# Patient Record
Sex: Female | Born: 1955 | Race: White | Hispanic: No | Marital: Married | Smoking: Never smoker
Health system: Southern US, Community
[De-identification: ages and names within clinical notes are randomized; demographics above are authoritative.]

## PROBLEM LIST (undated history)

## (undated) DIAGNOSIS — Z6841 Body Mass Index (BMI) 40.0 and over, adult: Secondary | ICD-10-CM

## (undated) DIAGNOSIS — K219 Gastro-esophageal reflux disease without esophagitis: Secondary | ICD-10-CM

## (undated) DIAGNOSIS — Z87442 Personal history of urinary calculi: Secondary | ICD-10-CM

## (undated) DIAGNOSIS — C801 Malignant (primary) neoplasm, unspecified: Secondary | ICD-10-CM

## (undated) DIAGNOSIS — M1990 Unspecified osteoarthritis, unspecified site: Secondary | ICD-10-CM

## (undated) HISTORY — PX: CHOLECYSTECTOMY: SHX55

---

## 2015-06-16 HISTORY — PX: RETINAL LASER PROCEDURE: SHX2339

## 2015-06-16 HISTORY — PX: CATARACT EXTRACTION: SUR2

## 2016-01-01 ENCOUNTER — Other Ambulatory Visit (HOSPITAL_COMMUNITY): Payer: Self-pay | Admitting: General Surgery

## 2016-01-14 ENCOUNTER — Other Ambulatory Visit: Payer: Self-pay

## 2016-01-14 ENCOUNTER — Ambulatory Visit (HOSPITAL_COMMUNITY)
Admission: RE | Admit: 2016-01-14 | Discharge: 2016-01-14 | Disposition: A | Discharge status: Home / Self Care | Payer: BC Managed Care – PPO | Source: Ambulatory Visit | Attending: General Surgery | Admitting: General Surgery

## 2016-01-14 DIAGNOSIS — K222 Esophageal obstruction: Secondary | ICD-10-CM | POA: Insufficient documentation

## 2016-01-14 DIAGNOSIS — K219 Gastro-esophageal reflux disease without esophagitis: Secondary | ICD-10-CM | POA: Insufficient documentation

## 2016-01-14 DIAGNOSIS — N200 Calculus of kidney: Secondary | ICD-10-CM | POA: Diagnosis not present

## 2016-01-14 DIAGNOSIS — I700 Atherosclerosis of aorta: Secondary | ICD-10-CM | POA: Diagnosis not present

## 2016-01-14 DIAGNOSIS — Z6841 Body Mass Index (BMI) 40.0 and over, adult: Secondary | ICD-10-CM | POA: Insufficient documentation

## 2016-01-14 DIAGNOSIS — K449 Diaphragmatic hernia without obstruction or gangrene: Secondary | ICD-10-CM | POA: Diagnosis not present

## 2016-01-16 ENCOUNTER — Encounter: Payer: Self-pay | Admitting: Dietician

## 2016-01-16 ENCOUNTER — Encounter: Payer: BC Managed Care – PPO | Attending: General Surgery | Admitting: Dietician

## 2016-01-16 DIAGNOSIS — Z713 Dietary counseling and surveillance: Secondary | ICD-10-CM | POA: Diagnosis not present

## 2016-01-16 DIAGNOSIS — Z01818 Encounter for other preprocedural examination: Secondary | ICD-10-CM | POA: Insufficient documentation

## 2016-01-16 NOTE — Progress Notes (Signed)
  Pre-Op Assessment Visit:  Pre-Operative RYGB or Sleeve Gastrectomy Surgery  Medical Nutrition Therapy:  Appt start time: 1000   End time:  1100.  Patient was seen on 01/16/2016 for Pre-Operative Nutrition Assessment. Assessment and letter of approval faxed to [facility] Bariatric Surgery Program coordinator on 01/16/2016.   Preferred Learning Style:   No preference indicated   Learning Readiness:   Ready  Handouts given during visit include:  Pre-Op Goals Bariatric Surgery Protein Shakes   During the appointment today the following Pre-Op Goals were reviewed with the patient: Maintain or lose weight as instructed by your surgeon Make healthy food choices Begin to limit portion sizes Limited concentrated sugars and fried foods Keep fat/sugar in the single digits per serving on   food labels Practice CHEWING your food  (aim for 30 chews per bite or until applesauce consistency) Practice not drinking 15 minutes before, during, and 30 minutes after each meal/snack Avoid all carbonated beverages  Avoid/limit caffeinated beverages  Avoid all sugar-sweetened beverages Consume 3 meals per day; eat every 3-5 hours Make a list of non-food related activities Aim for 64-100 ounces of FLUID daily  Aim for at least 60-80 grams of PROTEIN daily Look for a liquid protein source that contain ?15 g protein and ?5 g carbohydrate  (ex: shakes, drinks, shots)  Demonstrated degree of understanding via:  Teach Back  Teaching Method Utilized:  Visual Auditory Hands on  Barriers to learning/adherence to lifestyle change: none  Patient to call the [facility] to enroll in Pre-Op and Post-Op Nutrition Education when surgery date is scheduled.

## 2016-01-16 NOTE — Patient Instructions (Signed)
Follow Pre-Op Goals Try Protein Shakes Call [facility] at [phone] when surgery is scheduled to enroll in Pre-Op Class  Things to remember:  Please always be honest with [place]. We want to support you!  If you have any questions or concerns in between appointments, please call or email Ferol Luz, or Margarita Grizzle.  The diet after surgery will be high protein and low in carbohydrate.  Vitamins and calcium need to be taken for the rest of your life.  Feel free to include support people in any classes or appointments.   Supplement recommendations:  "Complete" Multivitamin: Sleeve Gastrectomy and RYGB patients take a double dose of MVI. Vitamin must be liquid or chewable but not gummy. Examples of these include Flintstones Complete and Centrum Complete. If the vitamin is bariatric-specific, take 1 dose as it is already formulated for bariatric surgery patients. Examples of these are Bariatric Advantage, Celebrate, and [employer]. These can be found at the [facility] and/or online.     Calcium citrate: 1500 mg/day of Calcium citrate (also chewable or liquid) is recommended for all procedures. The body is only able to absorb 500-600 mg of Calcium at one time so 3 daily doses of 500 mg are recommended. Calcium doses must be taken a minimum of 2 hours apart. Additionally, Calcium must be taken 2 hours apart from iron-containing MVI. Examples of brands include Celebrate, Bariatric Advantage, and Wellesse. These brands must be purchased online or at the [facility]. Citracal Petites is the only Calcium citrate supplement found in general grocery stores and pharmacies. This is in tablet form and may be recommended for patients who do not tolerate chewable Calcium.  Continued or added Vitamin D supplementation based on individual needs.    Vitamin B12: 300-500 mcg/day for Sleeve Gastrectomy and RYGB. Must be taken intramuscularly, sublingually, or inhaled nasally. Oral route  is not recommended.

## 2016-02-24 ENCOUNTER — Ambulatory Visit: Payer: Self-pay | Admitting: General Surgery

## 2016-02-24 ENCOUNTER — Encounter: Payer: BC Managed Care – PPO | Attending: General Surgery

## 2016-02-24 DIAGNOSIS — Z713 Dietary counseling and surveillance: Secondary | ICD-10-CM | POA: Diagnosis present

## 2016-02-24 DIAGNOSIS — Z01818 Encounter for other preprocedural examination: Secondary | ICD-10-CM | POA: Insufficient documentation

## 2016-02-26 NOTE — Progress Notes (Signed)
  Pre-Operative Nutrition Class:  Appt start time: 2091   End time:  1830.  Patient was seen on 02/24/2016 for Pre-Operative Bariatric Surgery Education at the [facility].   Surgery date:  Surgery type: RYGB Start weight at [facility]: 256 lbs on 01/16/2016 Weight today: 261.8 lbs  TANITA  BODY COMP RESULTS  02/24/16   BMI (kg/m^2) 43.6   Fat Mass (lbs) 134.6   Fat Free Mass (lbs) 127.2   Total Body Water (lbs) 93.2   Samples given per MNT protocol. Patient educated on appropriate usage: Bariatric Advantage Multivitamin (mixed fruit - qty 1) Lot #: [id number] Exp: 12/2016  Bariatric Advantage Calcium Citrate chew (strawberry - qty 1) Lot #: [id number] Exp: 10/2016  Premier protein shake (qty 1 - chocolate) Lot #: [id number] Exp: 12/2016   The following the learning objectives were met by the patient during this course:  Identify Pre-Op Dietary Goals and will begin 2 weeks pre-operatively  Identify appropriate sources of fluids and proteins   State protein recommendations and appropriate sources pre and post-operatively  Identify Post-Operative Dietary Goals and will follow for 2 weeks post-operatively  Identify appropriate multivitamin and calcium sources  Describe the need for physical activity post-operatively and will follow MD recommendations  State when to call healthcare provider regarding medication questions or post-operative complications  Handouts given during class include:  Pre-Op Bariatric Surgery Diet Handout  Protein Shake Handout  Post-Op Bariatric Surgery Nutrition Handout  BELT Program Information Flyer  Support Group Information Flyer  [facility] Bariatric Supplements Price List  Follow-Up Plan: Patient will follow-up at [facility] 2 weeks post operatively for diet advancement per MD.

## 2016-02-28 ENCOUNTER — Ambulatory Visit: Payer: Self-pay | Admitting: General Surgery

## 2016-02-28 NOTE — H&P (Signed)
Jenny Thornton 02/28/2016 1:22 PM Location: [facility] Patient #: [id number] DOB: 03-29-1956 Married / Language: English / Race: White Female  History of Present Illness Randall Hiss M. Aliena Ghrist MD; 02/28/2016 3:20 PM) Patient words: preop.  The patient is a 60 year old female who presents for a pre-op visit. She comes in today for her preoperative visit. I initially met her for weight loss surgery on July 14. Her weight at time was 254 pounds. She has been approved for laparoscopic Roux-en-Y gastric bypass with possible hiatal hernia repair. She denies any medical changes since I initially saw her in mid July. She denies a emergency room hospital. She denies any chest pain, chest pressure, shortness of breath, TIAs, amaurosis fugax, dysphagia exertion, orthopnea. She denies any dysphagia. She denies any reflux.  Her workup revealed a vitamin D deficiency. Her vitamin D level was 11. I put her on 50,000 international units of vitamin D once a week. She has completed this. She also was found to be prediabetic with a hemoglobin A1c of 6.1%. Otherwise her evaluation labs were within normal limits. Her chest x-ray and EKG were unremarkable.  Her GI showed a small hiatal hernia and a distal esophageal ring.   Problem List/Past Medical Randall Hiss Ronnie Derby, MD; 02/28/2016 3:26 PM) PREDIABETES (R73.03) MORBID OBESITY WITH BMI OF 40.0-44.9, ADULT (E66.01) VITAMIN D DEFICIENCY (E55.9) vit d was 42  Other Problems Gayland Curry, MD; 02/28/2016 3:26 PM) GASTROESOPHAGEAL REFLUX DISEASE, ESOPHAGITIS PRESENCE NOT SPECIFIED (K21.9) ARTHRITIS (M19.90) Migraine Headache Diverticulosis Cholelithiasis Kidney Stone Hemorrhoids Back Pain  Past Surgical History Gayland Curry, MD; 02/28/2016 3:26 PM) Gallbladder Surgery - Laparoscopic Cesarean Section - Multiple  Diagnostic Studies History Gayland Curry, MD; 02/28/2016 3:26 PM) Mammogram within last year Colonoscopy 1-5 years  ago Pap Smear 1-5 years ago  Allergies (Ammie Eversole, LPN; 4/78/2956 2:13 PM) Codeine Sulfate *ANALGESICS - OPIOID* Rash. Morphine Sulfate ER *ANALGESICS - OPIOID* Penicillin V *PENICILLINS* Rash.  Medication History Gayland Curry, MD; 02/28/2016 3:26 PM) Vitamin D (Ergocalciferol) (50000UNIT Capsule, 1 (one) Capsule Oral weekly, Taken starting 01/20/2016) Active. MethylPREDNISolone (4MG Tab Ther Pack, Oral) Active. B Complex 100 (Oral) Active. Cyclobenzaprine HCl (5MG Tablet, Oral) Active. LORazepam (0.5MG Tablet, Oral) Active. Fiber Formula (Oral) Active. Multiple Vitamins (Oral) Active. Vitamin D3 (2000UNIT Capsule, Oral) Active. Cyanocobalamin (500MCG Tablet ER, Oral) Active. Unisom (25MG Tablet, Oral) Active. Medications Reconciled OxyCODONE HCl (5MG/5ML Solution, 5-10 Milliliter Oral every four hours, as needed, Taken starting 02/28/2016) Active. Zofran ODT (4MG Tablet Disint, 1 (one) Tablet Disperse Oral every six hours, as needed, Taken starting 02/28/2016) Active. Pantoprazole Sodium (40MG Tablet DR, 1 (one) Tablet Oral daily, Taken starting 02/28/2016) Active.  Social History Gayland Curry, MD; 02/28/2016 3:26 PM) No drug use No alcohol use Tobacco use Never smoker.  Family History Gayland Curry, MD; 02/28/2016 3:26 PM) Cancer Mother. Alcohol Abuse Daughter, Family Members In General. Colon Polyps Mother. Cerebrovascular Accident Mother. Hypertension Father. Depression Daughter. Respiratory Condition Father. Prostate Cancer Father.  Pregnancy / Birth History Gayland Curry, MD; 02/28/2016 3:26 PM) Durenda Age 1 Contraceptive History Oral contraceptives. Length (months) of breastfeeding 3-6 Irregular periods Age of menopause [age] Age at menarche 8 years. Para 3     Review of Systems Randall Hiss M. Erez Mccallum MD; 02/28/2016 3:18 PM) General Not Present- Appetite Loss, Chills, Fatigue, Fever, Night Sweats, Weight Gain and Weight  Loss. Skin Not Present- Change in Wart/Mole, Dryness, Hives, Jaundice, New Lesions, Non-Healing Wounds, Rash and Ulcer. HEENT Present- Ringing in the  Ears. Not Present- Earache, Hearing Loss, Hoarseness, Nose Bleed, Oral Ulcers, Seasonal Allergies, Sinus Pain, Sore Throat, Visual Disturbances, Wears glasses/contact lenses and Yellow Eyes. Respiratory Present- Snoring. Not Present- Bloody sputum, Chronic Cough, Difficulty Breathing and Wheezing. Breast Not Present- Breast Mass, Breast Pain, Nipple Discharge and Skin Changes. Cardiovascular Not Present- Chest Pain, Difficulty Breathing Lying Down, Leg Cramps, Palpitations, Rapid Heart Rate, Shortness of Breath and Swelling of Extremities. Gastrointestinal Present- Hemorrhoids and Indigestion. Not Present- Abdominal Pain, Bloating, Bloody Stool, Change in Bowel Habits, Chronic diarrhea, Constipation, Difficulty Swallowing, Excessive gas, Gets full quickly at meals, Nausea, Rectal Pain and Vomiting. Female Genitourinary Not Present- Frequency, Nocturia, Painful Urination, Pelvic Pain and Urgency. Musculoskeletal Present- Back Pain, Joint Pain, Joint Stiffness, Muscle Pain and Muscle Weakness. Not Present- Swelling of Extremities. Neurological Not Present- Decreased Memory, Fainting, Headaches, Numbness, Seizures, Tingling, Tremor, Trouble walking and Weakness. Psychiatric Not Present- Anxiety, Bipolar, Change in Sleep Pattern, Depression, Fearful and Frequent crying. Endocrine Not Present- Cold Intolerance, Excessive Hunger, Hair Changes, Heat Intolerance, Hot flashes and New Diabetes. Hematology Not Present- Blood Thinners, Easy Bruising, Excessive bleeding, Gland problems, HIV and Persistent Infections.  Vitals (Ammie Eversole LPN; 5/97/4163 8:45 PM) 02/28/2016 1:22 PM Weight: 258.2 lb Height: 65in Body Surface Area: 2.2 m Body Mass Index: 42.97 kg/m  Temp.: 98.68F(Oral)  Pulse: 100 (Regular)  BP: 126/82 (Sitting, Left Arm,  Standard)      Physical Exam Randall Hiss M. Carmesha Morocco MD; 02/28/2016 3:18 PM)  General Mental Status-Alert. General Appearance-Consistent with stated age. Hydration-Well hydrated. Voice-Normal. Note: Morbidly obese  Head and Neck Head-normocephalic, atraumatic with no lesions or palpable masses. Trachea-midline. Thyroid Gland Characteristics - normal size and consistency.  Eye Eyeball - Bilateral-Extraocular movements intact. Sclera/Conjunctiva - Bilateral-No scleral icterus.  Chest and Lung Exam Chest and lung exam reveals -quiet, even and easy respiratory effort with no use of accessory muscles and on auscultation, normal breath sounds, no adventitious sounds and normal vocal resonance. Inspection Chest Wall - Normal. Back - normal.  Breast - Did not examine.  Cardiovascular Cardiovascular examination reveals -normal heart sounds, regular rate and rhythm with no murmurs and normal pedal pulses bilaterally.  Abdomen Inspection Inspection of the abdomen reveals - No Hernias. Skin - Scar - Note: Well-healed Pfannenstiel incision. Palpation/Percussion Palpation and Percussion of the abdomen reveal - Soft, Non Tender, No Rebound tenderness, No Rigidity (guarding) and No hepatosplenomegaly. Auscultation Auscultation of the abdomen reveals - Bowel sounds normal.  Peripheral Vascular Upper Extremity Palpation - Pulses bilaterally normal.  Neurologic Neurologic evaluation reveals -alert and oriented x 3 with no impairment of recent or remote memory. Mental Status-Normal.  Neuropsychiatric The patient's mood and affect are described as -normal. Judgment and Insight-insight is appropriate concerning matters relevant to self.  Musculoskeletal Normal Exam - Left-Upper Extremity Strength Normal and Lower Extremity Strength Normal. Normal Exam - Right-Upper Extremity Strength Normal and Lower Extremity Strength Normal.  Lymphatic Head &  Neck  General Head & Neck Lymphatics: Bilateral - Description - Normal. Axillary - Did not examine. Femoral & Inguinal - Did not examine.    Assessment & Plan Randall Hiss M. Preet Mangano MD; 02/28/2016 3:25 PM)  MORBID OBESITY WITH BMI OF 40.0-44.9, ADULT (E66.01) Impression: We reviewed her preoperative workup. We discussed the results of her labs. We also discussed the results of her upper GI. It doesn't sound like she has any issues with food getting stuck. It does not sound like she has any issues with solids or liquids. She has trouble taking pills. We reviewed the  postoperative course. She was given prescriptions for pain, nausea, and heartburn medications. We discussed the importance of the preoperative diet plan. We also gave her her preoperative bowel instructions. I answered all of her questions she had. She is scheduled for laparoscopic Roux-en-Y gastric bypass with possible hiatal hernia repair in the near future  Current Plans Pt Education - EMW_preopbariatric Started OxyCODONE HCl 5MG/5ML, 5-10 Milliliter every four hours, as needed, 200 Milliliter, 02/28/2016, No Refill. Started Zofran ODT 4MG, 1 (one) Tablet Disperse every six hours, as needed, #20, 02/28/2016, No Refill. Started Pantoprazole Sodium 40MG, 1 (one) Tablet daily, #30, 30 days starting 02/28/2016, Ref. x3. VITAMIN D DEFICIENCY (E55.9) Story: vit d was 11 Impression: She completed 50,000 international units of vitamin D for 6 weeks. We will recheck her vitamin D level while she is in the hospital for surgery  GASTROESOPHAGEAL REFLUX DISEASE, ESOPHAGITIS PRESENCE NOT SPECIFIED (K21.9)  ARTHRITIS (M19.90)  PREDIABETES (R73.03)  Leighton Ruff. Redmond Pulling, MD, FACS General, Bariatric, & Minimally Invasive Surgery [facility] Surgery, [place]

## 2016-03-04 NOTE — Patient Instructions (Addendum)
Jenny Thornton  03/04/2016   Your procedure is scheduled on: 03-16-16  Report to [facility]  Entrance take [facility]  elevators to 3rd floor to  [place] at  [place] AM.  Call this number if you have problems the morning of surgery [phone]  Bowel prep instructions per MD.  Remember: ONLY 1 PERSON MAY GO WITH YOU TO SHORT STAY TO GET  READY MORNING OF YOUR SURGERY.  Do not eat food or drink liquids :After Midnight.     Take these medicines the morning of surgery with A SIP OF WATER: Lorazepam-if need. DO NOT TAKE ANY DIABETIC MEDICATIONS DAY OF YOUR SURGERY                               You may not have any metal on your body including hair pins and              piercings  Do not wear jewelry, make-up, lotions, powders or perfumes, deodorant             Do not wear nail polish.  Do not shave  48 hours prior to surgery.              Men may shave face and neck.   Do not bring valuables to the hospital. [place].  Contacts, dentures or bridgework may not be worn into surgery.  Leave suitcase in the car. After surgery it may be brought to your room.     Patients discharged the day of surgery will not be allowed to drive home.  Name and phone number of your driver: Hart Robinsons [phone] cell  Special Instructions: N/A              Please read over the following fact sheets you were given: _____________________________________________________________________             [facility] - Preparing for Surgery Before surgery, you can play an important role.  Because skin is not sterile, your skin needs to be as free of germs as possible.  You can reduce the number of germs on your skin by washing with CHG (chlorahexidine gluconate) soap before surgery.  CHG is an antiseptic cleaner which kills germs and bonds with the skin to continue killing germs even after washing. Please DO NOT use if you have an  allergy to CHG or antibacterial soaps.  If your skin becomes reddened/irritated stop using the CHG and inform your nurse when you arrive at Short Stay. Do not shave (including legs and underarms) for at least 48 hours prior to the first CHG shower.  You may shave your face/neck. Please follow these instructions carefully:  1.  Shower with CHG Soap the night before surgery and the  morning of Surgery.  2.  If you choose to wash your hair, wash your hair first as usual with your  normal  shampoo.  3.  After you shampoo, rinse your hair and body thoroughly to remove the  shampoo.                           4.  Use CHG as you would any other liquid soap.  You can apply chg  directly  to the skin and wash                       Gently with a scrungie or clean washcloth.  5.  Apply the CHG Soap to your body ONLY FROM THE NECK DOWN.   Do not use on face/ open                           Wound or open sores. Avoid contact with eyes, ears mouth and genitals (private parts).                       Wash face,  Genitals (private parts) with your normal soap.             6.  Wash thoroughly, paying special attention to the area where your surgery  will be performed.  7.  Thoroughly rinse your body with warm water from the neck down.  8.  DO NOT shower/wash with your normal soap after using and rinsing off  the CHG Soap.                9.  Pat yourself dry with a clean towel.            10.  Wear clean pajamas.            11.  Place clean sheets on your bed the night of your first shower and do not  sleep with pets. Day of Surgery : Do not apply any lotions/deodorants the morning of surgery.  Please wear clean clothes to the hospital/surgery center.  FAILURE TO FOLLOW THESE INSTRUCTIONS MAY RESULT IN THE CANCELLATION OF YOUR SURGERY PATIENT SIGNATURE_________________________________  NURSE SIGNATURE__________________________________  ________________________________________________________________________

## 2016-03-06 ENCOUNTER — Encounter (INDEPENDENT_AMBULATORY_CARE_PROVIDER_SITE_OTHER): Payer: Self-pay

## 2016-03-06 ENCOUNTER — Encounter (HOSPITAL_COMMUNITY)
Admission: RE | Admit: 2016-03-06 | Discharge: 2016-03-06 | Disposition: A | Discharge status: Home / Self Care | Payer: BC Managed Care – PPO | Source: Ambulatory Visit | Attending: General Surgery | Admitting: General Surgery

## 2016-03-06 ENCOUNTER — Encounter (HOSPITAL_COMMUNITY): Payer: Self-pay

## 2016-03-06 DIAGNOSIS — Z01812 Encounter for preprocedural laboratory examination: Secondary | ICD-10-CM | POA: Diagnosis present

## 2016-03-06 DIAGNOSIS — Z6841 Body Mass Index (BMI) 40.0 and over, adult: Secondary | ICD-10-CM | POA: Diagnosis not present

## 2016-03-06 HISTORY — DX: Body Mass Index (BMI) 40.0 and over, adult: Z684

## 2016-03-06 HISTORY — DX: Malignant (primary) neoplasm, unspecified: C80.1

## 2016-03-06 HISTORY — DX: Personal history of urinary calculi: Z87.442

## 2016-03-06 HISTORY — DX: Unspecified osteoarthritis, unspecified site: M19.90

## 2016-03-06 HISTORY — DX: Gastro-esophageal reflux disease without esophagitis: K21.9

## 2016-03-06 LAB — COMPREHENSIVE METABOLIC PANEL
ALT: 39 U/L (ref 14–54)
ANION GAP: 8 (ref 5–15)
AST: 32 U/L (ref 15–41)
Albumin: 4.4 g/dL (ref 3.5–5.0)
Alkaline Phosphatase: 67 U/L (ref 38–126)
BUN: 20 mg/dL (ref 6–20)
CHLORIDE: 105 mmol/L (ref 101–111)
CO2: 26 mmol/L (ref 22–32)
CREATININE: 0.64 mg/dL (ref 0.44–1.00)
Calcium: 9.2 mg/dL (ref 8.9–10.3)
GFR calc non Af Amer: >60 mL/min (ref >60)
Glucose, Bld: 85 mg/dL (ref 65–99)
POTASSIUM: 4 mmol/L (ref 3.5–5.1)
SODIUM: 139 mmol/L (ref 135–145)
Total Bilirubin: 0.5 mg/dL (ref 0.3–1.2)
Total Protein: 7.8 g/dL (ref 6.5–8.1)

## 2016-03-06 LAB — CBC WITH DIFFERENTIAL/PLATELET
BASOS ABS: 0 10*3/uL (ref 0.0–0.1)
Basophils Relative: 1 %
EOS ABS: 0.3 10*3/uL (ref 0.0–0.7)
EOS PCT: 4 %
HCT: 42.4 % (ref 36.0–46.0)
Hemoglobin: 14.1 g/dL (ref 12.0–15.0)
LYMPHS ABS: 2.3 10*3/uL (ref 0.7–4.0)
Lymphocytes Relative: 36 %
MCH: 29.3 pg (ref 26.0–34.0)
MCHC: 33.3 g/dL (ref 30.0–36.0)
MCV: 88 fL (ref 78.0–100.0)
Monocytes Absolute: 0.6 10*3/uL (ref 0.1–1.0)
Monocytes Relative: 8 %
Neutro Abs: 3.4 10*3/uL (ref 1.7–7.7)
Neutrophils Relative %: 51 %
PLATELETS: 274 10*3/uL (ref 150–400)
RBC: 4.82 MIL/uL (ref 3.87–5.11)
RDW: 13.4 % (ref 11.5–15.5)
WBC: 6.6 10*3/uL (ref 4.0–10.5)

## 2016-03-06 NOTE — Pre-Procedure Instructions (Signed)
EKG, CXR 8'17 Epic

## 2016-03-16 ENCOUNTER — Encounter (HOSPITAL_COMMUNITY): Payer: Self-pay | Admitting: *Deleted

## 2016-03-16 ENCOUNTER — Inpatient Hospital Stay (HOSPITAL_COMMUNITY): Payer: BC Managed Care – PPO | Admitting: Certified Registered"

## 2016-03-16 ENCOUNTER — Inpatient Hospital Stay (HOSPITAL_COMMUNITY)
Admission: RE | Admit: 2016-03-16 | Discharge: 2016-03-18 | DRG: 621 | Disposition: A | Discharge status: Home / Self Care | Payer: BC Managed Care – PPO | Source: Ambulatory Visit | Attending: General Surgery | Admitting: General Surgery

## 2016-03-16 ENCOUNTER — Encounter (HOSPITAL_COMMUNITY)
Admission: RE | Disposition: A | Discharge status: Home / Self Care | Payer: Self-pay | Source: Ambulatory Visit | Attending: General Surgery

## 2016-03-16 DIAGNOSIS — M1990 Unspecified osteoarthritis, unspecified site: Secondary | ICD-10-CM | POA: Diagnosis present

## 2016-03-16 DIAGNOSIS — R110 Nausea: Secondary | ICD-10-CM

## 2016-03-16 DIAGNOSIS — K219 Gastro-esophageal reflux disease without esophagitis: Secondary | ICD-10-CM | POA: Diagnosis present

## 2016-03-16 DIAGNOSIS — E559 Vitamin D deficiency, unspecified: Secondary | ICD-10-CM | POA: Diagnosis present

## 2016-03-16 DIAGNOSIS — Z885 Allergy status to narcotic agent status: Secondary | ICD-10-CM

## 2016-03-16 DIAGNOSIS — K449 Diaphragmatic hernia without obstruction or gangrene: Secondary | ICD-10-CM | POA: Diagnosis present

## 2016-03-16 DIAGNOSIS — E66813 Obesity, class 3: Secondary | ICD-10-CM | POA: Diagnosis present

## 2016-03-16 DIAGNOSIS — Z79899 Other long term (current) drug therapy: Secondary | ICD-10-CM | POA: Diagnosis not present

## 2016-03-16 DIAGNOSIS — R7303 Prediabetes: Secondary | ICD-10-CM | POA: Diagnosis present

## 2016-03-16 DIAGNOSIS — Z9884 Bariatric surgery status: Secondary | ICD-10-CM

## 2016-03-16 DIAGNOSIS — Z880 Allergy status to penicillin: Secondary | ICD-10-CM | POA: Diagnosis not present

## 2016-03-16 DIAGNOSIS — Z6841 Body Mass Index (BMI) 40.0 and over, adult: Secondary | ICD-10-CM | POA: Diagnosis not present

## 2016-03-16 HISTORY — PX: LAPAROSCOPIC ROUX-EN-Y GASTRIC BYPASS WITH HIATAL HERNIA REPAIR: SHX6513

## 2016-03-16 LAB — HEMOGLOBIN AND HEMATOCRIT, BLOOD
HCT: 42.3 % (ref 36.0–46.0)
HEMOGLOBIN: 14.2 g/dL (ref 12.0–15.0)

## 2016-03-16 SURGERY — CREATION, GASTRIC BYPASS, LAPAROSCOPIC, USING ROUX-EN-Y GASTROENTEROSTOMY, WITH HIATAL HERNIA REPAIR
Anesthesia: General | Site: Abdomen

## 2016-03-16 MED ORDER — LACTATED RINGERS IV SOLN: INTRAVENOUS | Status: DC

## 2016-03-16 MED ORDER — SCOPOLAMINE 1 MG/3DAYS TD PT72
MEDICATED_PATCH | TRANSDERMAL | Status: DC | PRN
  Administered 2016-03-16: 1 via TRANSDERMAL

## 2016-03-16 MED ORDER — SUCCINYLCHOLINE CHLORIDE 200 MG/10ML IV SOSY
PREFILLED_SYRINGE | INTRAVENOUS | Status: DC | PRN
  Administered 2016-03-16: 100 mg via INTRAVENOUS

## 2016-03-16 MED ORDER — SCOPOLAMINE 1 MG/3DAYS TD PT72
MEDICATED_PATCH | TRANSDERMAL | Status: AC
  Filled 2016-03-16: qty 1

## 2016-03-16 MED ORDER — DEXAMETHASONE SODIUM PHOSPHATE 10 MG/ML IJ SOLN
INTRAMUSCULAR | Status: AC
  Filled 2016-03-16: qty 1

## 2016-03-16 MED ORDER — LEVOFLOXACIN IN D5W 750 MG/150ML IV SOLN
750.0000 mg | INTRAVENOUS | Status: AC
  Administered 2016-03-16: 750 mg via INTRAVENOUS
  Filled 2016-03-16: qty 150

## 2016-03-16 MED ORDER — PROPOFOL 10 MG/ML IV BOLUS
INTRAVENOUS | Status: DC | PRN
  Administered 2016-03-16: 180 mg via INTRAVENOUS

## 2016-03-16 MED ORDER — ACETAMINOPHEN 10 MG/ML IV SOLN
1000.0000 mg | Freq: Once | INTRAVENOUS | Status: AC
  Administered 2016-03-16: 1000 mg via INTRAVENOUS

## 2016-03-16 MED ORDER — ACETAMINOPHEN 160 MG/5ML PO SOLN: 325.0000 mg | ORAL | Status: DC | PRN

## 2016-03-16 MED ORDER — LACTATED RINGERS IV SOLN
INTRAVENOUS | Status: DC | PRN
  Administered 2016-03-16 (×2): via INTRAVENOUS

## 2016-03-16 MED ORDER — STERILE WATER FOR IRRIGATION IR SOLN
Status: DC | PRN
  Administered 2016-03-16: 2000 mL

## 2016-03-16 MED ORDER — LIDOCAINE 2% (20 MG/ML) 5 ML SYRINGE
INTRAMUSCULAR | Status: DC | PRN
  Administered 2016-03-16: 100 mg via INTRAVENOUS

## 2016-03-16 MED ORDER — ROCURONIUM BROMIDE 10 MG/ML (PF) SYRINGE
PREFILLED_SYRINGE | INTRAVENOUS | Status: DC | PRN
Start: 2016-03-16 — End: 2016-03-16
  Administered 2016-03-16: 10 mg via INTRAVENOUS
  Administered 2016-03-16: 5 mg via INTRAVENOUS
  Administered 2016-03-16 (×2): 10 mg via INTRAVENOUS
  Administered 2016-03-16: 45 mg via INTRAVENOUS
  Administered 2016-03-16: 10 mg via INTRAVENOUS
  Administered 2016-03-16: 5 mg via INTRAVENOUS

## 2016-03-16 MED ORDER — PROPOFOL 10 MG/ML IV BOLUS
INTRAVENOUS | Status: AC
  Filled 2016-03-16: qty 20

## 2016-03-16 MED ORDER — DIPHENHYDRAMINE HCL 50 MG/ML IJ SOLN: 12.5000 mg | Freq: Three times a day (TID) | INTRAMUSCULAR | Status: DC | PRN

## 2016-03-16 MED ORDER — PROMETHAZINE HCL 25 MG/ML IJ SOLN: 12.5000 mg | Freq: Four times a day (QID) | INTRAMUSCULAR | Status: DC | PRN

## 2016-03-16 MED ORDER — FENTANYL CITRATE (PF) 100 MCG/2ML IJ SOLN
25.0000 ug | INTRAMUSCULAR | Status: DC | PRN
  Administered 2016-03-16 – 2016-03-17 (×3): 25 ug via INTRAVENOUS
  Filled 2016-03-16 (×4): qty 2

## 2016-03-16 MED ORDER — MIDAZOLAM HCL 2 MG/2ML IJ SOLN
INTRAMUSCULAR | Status: AC
  Filled 2016-03-16: qty 2

## 2016-03-16 MED ORDER — SUGAMMADEX SODIUM 500 MG/5ML IV SOLN
INTRAVENOUS | Status: AC
  Filled 2016-03-16: qty 5

## 2016-03-16 MED ORDER — ONDANSETRON HCL 4 MG/2ML IJ SOLN
INTRAMUSCULAR | Status: DC | PRN
  Administered 2016-03-16: 4 mg via INTRAVENOUS

## 2016-03-16 MED ORDER — PROMETHAZINE HCL 25 MG/ML IJ SOLN
6.2500 mg | INTRAMUSCULAR | Status: AC | PRN
  Administered 2016-03-16 (×2): 12.5 mg via INTRAVENOUS

## 2016-03-16 MED ORDER — FENTANYL CITRATE (PF) 100 MCG/2ML IJ SOLN: 25.0000 ug | INTRAMUSCULAR | Status: DC | PRN

## 2016-03-16 MED ORDER — ONDANSETRON HCL 4 MG/2ML IJ SOLN
INTRAMUSCULAR | Status: AC
  Filled 2016-03-16: qty 2

## 2016-03-16 MED ORDER — SUGAMMADEX SODIUM 500 MG/5ML IV SOLN
INTRAVENOUS | Status: DC | PRN
  Administered 2016-03-16: 250 mg via INTRAVENOUS

## 2016-03-16 MED ORDER — ONDANSETRON HCL 4 MG/2ML IJ SOLN
4.0000 mg | INTRAMUSCULAR | Status: DC | PRN
  Administered 2016-03-17 – 2016-03-18 (×2): 4 mg via INTRAVENOUS
  Filled 2016-03-16 (×2): qty 2

## 2016-03-16 MED ORDER — EVICEL 5 ML EX KIT
PACK | Freq: Once | CUTANEOUS | Status: AC
  Administered 2016-03-16: 5 mL
  Filled 2016-03-16: qty 1

## 2016-03-16 MED ORDER — ACETAMINOPHEN 10 MG/ML IV SOLN
INTRAVENOUS | Status: AC
  Filled 2016-03-16: qty 100

## 2016-03-16 MED ORDER — 0.9 % SODIUM CHLORIDE (POUR BTL) OPTIME
TOPICAL | Status: DC | PRN
  Administered 2016-03-16: 1000 mL

## 2016-03-16 MED ORDER — HEPARIN SODIUM (PORCINE) 5000 UNIT/ML IJ SOLN
5000.0000 [IU] | INTRAMUSCULAR | Status: AC
  Administered 2016-03-16: 5000 [IU] via SUBCUTANEOUS
  Filled 2016-03-16: qty 1

## 2016-03-16 MED ORDER — ACETAMINOPHEN 160 MG/5ML PO SOLN
650.0000 mg | ORAL | Status: DC | PRN
Start: 2016-03-17 — End: 2016-03-18
  Administered 2016-03-18: 650 mg via ORAL
  Filled 2016-03-16: qty 20.3

## 2016-03-16 MED ORDER — MIDAZOLAM HCL 5 MG/5ML IJ SOLN
INTRAMUSCULAR | Status: DC | PRN
  Administered 2016-03-16: 2 mg via INTRAVENOUS

## 2016-03-16 MED ORDER — OXYCODONE HCL 5 MG/5ML PO SOLN
5.0000 mg | ORAL | Status: DC | PRN
  Administered 2016-03-17: 5 mg via ORAL
  Filled 2016-03-16: qty 5

## 2016-03-16 MED ORDER — PREMIER PROTEIN SHAKE
2.0000 [oz_av] | ORAL | Status: DC
  Administered 2016-03-18: 2 [oz_av] via ORAL

## 2016-03-16 MED ORDER — SODIUM CHLORIDE 0.9 % IJ SOLN
INTRAMUSCULAR | Status: DC | PRN
  Administered 2016-03-16: 50 mL

## 2016-03-16 MED ORDER — SCOPOLAMINE 1 MG/3DAYS TD PT72
1.0000 | MEDICATED_PATCH | TRANSDERMAL | Status: DC
  Filled 2016-03-16: qty 1

## 2016-03-16 MED ORDER — PROMETHAZINE HCL 25 MG/ML IJ SOLN
INTRAMUSCULAR | Status: AC
  Administered 2016-03-16: 12.5 mg via INTRAVENOUS
  Filled 2016-03-16: qty 1

## 2016-03-16 MED ORDER — BUPIVACAINE LIPOSOME 1.3 % IJ SUSP
20.0000 mL | Freq: Once | INTRAMUSCULAR | Status: AC
  Administered 2016-03-16: 20 mL
  Filled 2016-03-16: qty 20

## 2016-03-16 MED ORDER — FAMOTIDINE IN NACL 20-0.9 MG/50ML-% IV SOLN
20.0000 mg | Freq: Two times a day (BID) | INTRAVENOUS | Status: DC
  Administered 2016-03-16 – 2016-03-17 (×4): 20 mg via INTRAVENOUS
  Filled 2016-03-16 (×4): qty 50

## 2016-03-16 MED ORDER — SODIUM CHLORIDE 0.9 % IJ SOLN
INTRAMUSCULAR | Status: AC
  Filled 2016-03-16: qty 50

## 2016-03-16 MED ORDER — FENTANYL CITRATE (PF) 100 MCG/2ML IJ SOLN
INTRAMUSCULAR | Status: DC | PRN
  Administered 2016-03-16: 50 ug via INTRAVENOUS
  Administered 2016-03-16: 100 ug via INTRAVENOUS
  Administered 2016-03-16 (×2): 50 ug via INTRAVENOUS

## 2016-03-16 MED ORDER — DEXAMETHASONE SODIUM PHOSPHATE 10 MG/ML IJ SOLN
INTRAMUSCULAR | Status: DC | PRN
  Administered 2016-03-16: 10 mg via INTRAVENOUS

## 2016-03-16 MED ORDER — LIDOCAINE 2% (20 MG/ML) 5 ML SYRINGE
INTRAMUSCULAR | Status: AC
  Filled 2016-03-16: qty 5

## 2016-03-16 MED ORDER — ACETAMINOPHEN 10 MG/ML IV SOLN
1000.0000 mg | Freq: Four times a day (QID) | INTRAVENOUS | Status: AC
  Administered 2016-03-16 – 2016-03-17 (×4): 1000 mg via INTRAVENOUS
  Filled 2016-03-16 (×4): qty 100

## 2016-03-16 MED ORDER — KCL IN DEXTROSE-NACL 20-5-0.45 MEQ/L-%-% IV SOLN
INTRAVENOUS | Status: DC
  Administered 2016-03-16 – 2016-03-18 (×5): via INTRAVENOUS
  Filled 2016-03-16 (×6): qty 1000

## 2016-03-16 MED ORDER — FENTANYL CITRATE (PF) 250 MCG/5ML IJ SOLN
INTRAMUSCULAR | Status: AC
  Filled 2016-03-16: qty 5

## 2016-03-16 MED ORDER — ROCURONIUM BROMIDE 10 MG/ML (PF) SYRINGE
PREFILLED_SYRINGE | INTRAVENOUS | Status: AC
  Filled 2016-03-16: qty 10

## 2016-03-16 MED ORDER — LACTATED RINGERS IR SOLN
Status: DC | PRN
  Administered 2016-03-16: 1000 mL

## 2016-03-16 MED ORDER — ENOXAPARIN SODIUM 30 MG/0.3ML ~~LOC~~ SOLN
30.0000 mg | Freq: Two times a day (BID) | SUBCUTANEOUS | Status: DC
  Administered 2016-03-17 (×2): 30 mg via SUBCUTANEOUS
  Filled 2016-03-16 (×2): qty 0.3

## 2016-03-16 SURGICAL SUPPLY — 69 items
APPLICATOR COTTON TIP 6IN STRL (MISCELLANEOUS) IMPLANT
BLADE SURG SZ11 CARB STEEL (BLADE) ×3 IMPLANT
CABLE HIGH FREQUENCY MONO STRZ (ELECTRODE) IMPLANT
CHLORAPREP W/TINT 26ML (MISCELLANEOUS) ×6 IMPLANT
CLIP SUT LAPRA TY ABSORB (SUTURE) ×6 IMPLANT
COVER SURGICAL LIGHT HANDLE (MISCELLANEOUS) ×3 IMPLANT
CUTTER FLEX LINEAR 45M (STAPLE) ×3 IMPLANT
DERMABOND ADVANCED (GAUZE/BANDAGES/DRESSINGS) ×2
DERMABOND ADVANCED .7 DNX12 (GAUZE/BANDAGES/DRESSINGS) ×1 IMPLANT
DEVICE SUT QUICK LOAD TK 5 (STAPLE) ×4 IMPLANT
DEVICE SUT TI-KNOT TK 5X26 (MISCELLANEOUS) IMPLANT
DEVICE SUTURE ENDOST 10MM (ENDOMECHANICALS) ×3 IMPLANT
DEVICE TI KNOT TK5 (MISCELLANEOUS)
DRAIN PENROSE 18X1/4 LTX STRL (WOUND CARE) ×3 IMPLANT
ELECT REM PT RETURN 9FT ADLT (ELECTROSURGICAL) ×3
ELECTRODE REM PT RTRN 9FT ADLT (ELECTROSURGICAL) ×1 IMPLANT
GAUZE SPONGE 4X4 12PLY STRL (GAUZE/BANDAGES/DRESSINGS) IMPLANT
GAUZE SPONGE 4X4 16PLY XRAY LF (GAUZE/BANDAGES/DRESSINGS) ×3 IMPLANT
GLOVE BIOGEL M STRL SZ7.5 (GLOVE) IMPLANT
GOWN STRL REUS W/TWL XL LVL3 (GOWN DISPOSABLE) ×12 IMPLANT
HOVERMATT SINGLE USE (MISCELLANEOUS) ×3 IMPLANT
IRRIG SUCT STRYKERFLOW 2 WTIP (MISCELLANEOUS) ×3
IRRIGATION SUCT STRKRFLW 2 WTP (MISCELLANEOUS) ×1 IMPLANT
KIT BASIN OR (CUSTOM PROCEDURE TRAY) ×3 IMPLANT
KIT GASTRIC LAVAGE 34FR ADT (SET/KITS/TRAYS/PACK) ×3 IMPLANT
LUBRICANT JELLY K Y 4OZ (MISCELLANEOUS) ×3 IMPLANT
MARKER SKIN DUAL TIP RULER LAB (MISCELLANEOUS) ×3 IMPLANT
NEEDLE SPNL 22GX3.5 QUINCKE BK (NEEDLE) ×3 IMPLANT
PACK CARDIOVASCULAR III (CUSTOM PROCEDURE TRAY) ×3 IMPLANT
QUICK LOAD TK 5 (STAPLE) ×2
RELOAD 45 VASCULAR/THIN (ENDOMECHANICALS) IMPLANT
RELOAD ENDO STITCH 2.0 (ENDOMECHANICALS) ×16
RELOAD STAPLE TA45 3.5 REG BLU (ENDOMECHANICALS) ×3 IMPLANT
RELOAD STAPLER BLUE 60MM (STAPLE) ×4 IMPLANT
RELOAD STAPLER GOLD 60MM (STAPLE) ×1 IMPLANT
RELOAD STAPLER WHITE 60MM (STAPLE) ×2 IMPLANT
SCISSORS LAP 5X45 EPIX DISP (ENDOMECHANICALS) ×3 IMPLANT
SEALANT SURGICAL APPL DUAL CAN (MISCELLANEOUS) ×3 IMPLANT
SHEARS HARMONIC ACE PLUS 45CM (MISCELLANEOUS) ×3 IMPLANT
SLEEVE ADV FIXATION 12X100MM (TROCAR) ×6 IMPLANT
SLEEVE XCEL OPT CAN 5 100 (ENDOMECHANICALS) IMPLANT
SOLUTION ANTI FOG 6CC (MISCELLANEOUS) ×3 IMPLANT
STAPLER ECHELON BIOABSB 60 FLE (MISCELLANEOUS) IMPLANT
STAPLER ECHELON LONG 60 440 (INSTRUMENTS) ×3 IMPLANT
STAPLER RELOAD BLUE 60MM (STAPLE) ×12
STAPLER RELOAD GOLD 60MM (STAPLE) ×3
STAPLER RELOAD WHITE 60MM (STAPLE) ×6
SUT MNCRL AB 4-0 PS2 18 (SUTURE) ×3 IMPLANT
SUT RELOAD ENDO STITCH 2 48X1 (ENDOMECHANICALS) ×4
SUT RELOAD ENDO STITCH 2.0 (ENDOMECHANICALS) ×4
SUT SURGIDAC NAB ES-9 0 48 120 (SUTURE) ×3 IMPLANT
SUT VIC AB 2-0 SH 27 (SUTURE) ×4
SUT VIC AB 2-0 SH 27X BRD (SUTURE) ×2 IMPLANT
SUTURE RELOAD END STTCH 2 48X1 (ENDOMECHANICALS) ×4 IMPLANT
SUTURE RELOAD ENDO STITCH 2.0 (ENDOMECHANICALS) ×4 IMPLANT
SYR 10ML ECCENTRIC (SYRINGE) ×3 IMPLANT
SYR 20CC LL (SYRINGE) ×6 IMPLANT
TOWEL OR 17X26 10 PK STRL BLUE (TOWEL DISPOSABLE) ×3 IMPLANT
TOWEL OR NON WOVEN STRL DISP B (DISPOSABLE) ×3 IMPLANT
TRAY FOLEY CATH SILVER 14FR (SET/KITS/TRAYS/PACK) ×3 IMPLANT
TRAY FOLEY W/METER SILVER 16FR (SET/KITS/TRAYS/PACK) IMPLANT
TROCAR ADV FIXATION 12X100MM (TROCAR) ×3 IMPLANT
TROCAR ADV FIXATION 5X100MM (TROCAR) ×3 IMPLANT
TROCAR BLADELESS OPT 5 100 (ENDOMECHANICALS) ×3 IMPLANT
TROCAR XCEL 12X100 BLDLESS (ENDOMECHANICALS) ×3 IMPLANT
TUBING CONNECTING 10 (TUBING) IMPLANT
TUBING CONNECTING 10' (TUBING)
TUBING ENDO SMARTCAP PENTAX (MISCELLANEOUS) ×3 IMPLANT
TUBING INSUF HEATED (TUBING) ×3 IMPLANT

## 2016-03-16 NOTE — Interval H&P Note (Signed)
History and Physical Interval Note:  03/16/2016 7:13 AM  Jenny Thornton  has presented today for surgery, with the diagnosis of Morbid Obesity, Hiatal Hernia, Vitamin D Deficiency  The various methods of treatment have been discussed with the patient and family. After consideration of risks, benefits and other options for treatment, the patient has consented to  Procedure(s): LAPAROSCOPIC ROUX-EN-Y GASTRIC BYPASS WITH HIATAL HERNIA REPAIR, UPPER ENDO (N/A) as a surgical intervention .  The patient's history has been reviewed, patient examined, no change in status, stable for surgery.  I have reviewed the patient's chart and labs.  Questions were answered to the patient's satisfaction.    Leighton Ruff. Redmond Pulling, MD, [place], Bariatric, & Minimally Invasive Surgery [facility] Surgery, [place]  [facility] M

## 2016-03-16 NOTE — Op Note (Signed)
Jenny Thornton [id number] May 14, 1956. 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 same  Surgical procedure: Laparoscopic Roux-en-Y gastric bypass (ante-colic, ante-gastric) with hiatal hernia repair; upper endoscopy  Surgeon: Gayland Curry, M.D. FACS  Asst.: Romana Juniper MD  Anesthesia: General plus exparel  Complications: None   EBL: Minimal   Drains: None   Disposition: PACU in good condition   Indications for procedure: 60yo female with morbid obesity who has been unsuccessful at sustained weight loss. The patient's comorbidities are listed above. We discussed the risk and benefits of surgery including but not limited to anesthesia risk, bleeding, infection, blood clot formation, anastomotic leak, anastomotic stricture, ulcer formation, death, respiratory complications, intestinal blockage, internal hernia, gallstone formation, vitamin and nutritional deficiencies, injury to surrounding structures, failure to lose weight and mood changes.   Description of procedure: Patient is brought to the operating room and general anesthesia induced. The patient had received preoperative broad-spectrum IV antibiotics and subcutaneous heparin. The abdomen was widely sterilely prepped with Chloraprep and draped. Patient timeout was performed and correct patient and procedure confirmed. Access was obtained with a 12 mm Optiview trocar in the left upper quadrant and pneumoperitoneum established without difficulty. Under direct vision 12 mm trocars were placed laterally in the right upper quadrant, right upper quadrant midclavicular line, and to the left and above the umbilicus for the camera port. A 5 mm trocar was placed laterally in the left upper quadrant.  The omentum was brought into the upper abdomen and the transverse mesocolon  elevated and the ligament of Treitz clearly identified. A 45cm biliopancreatic limb was then carefully measured from the ligament of Treitz. The small intestine was divided at this point with a single firing of the white load linear stapler. A Penrose drain was sutured to the end of the Roux-en-Y limb for later identification. A 100 cm Roux-en-Y limb was then carefully measured. At this point a side-to-side anastomosis was created between the Roux limb and the end of the biliopancreatic limb. This was accomplished with a single firing of the 60 mm white load linear stapler. The common enterotomy was closed with a running 2-0 Vicryl begun at either end of the enterotomy and tied centrally. There was one area where there was a small gap in the suture line so an additional u stitch of 2-0 vicryl was placed. Tisseel tissue sealant was placed over the anastomosis. The mesenteric defect was then closed with running 2-0 silk. The omentum was then divided with the harmonic scalpel up towards the transverse colon to allow mobility of the Roux limb toward the gastric pouch. The patient was then placed in steep reversed Trendelenburg. Through a 5 mm subxiphoid site the [facility] retractor was placed and the left lobe of the liver elevated with excellent exposure of the upper stomach and hiatus.  There was a small anterior dimple that was obviously visible. Her preop UGI showed a small hiatal hernia. The calibration tube was placed in the oropharynx and guided down into the stomach by the CRNA. 10 mL of air was insufflated into the calibration balloon. The calibration tubing was then gently pulled back by the CRNA and it slid past the GE junction. At this point the calibration tubing was desufflated and pulled back into the esophagus. This confirmed my suspicion of a clinically significant hiatal hernia. The gastrohepatic  ligament was incised with harmonic scalpel. The right crus was identified. We identified the crossing fat  along the right crus. The adipose tissue just above this area was incised with harmonic scalpel. I then bluntly dissected out this area and identified the left crus. There was evidence of a hiatal hernia. I then mobilized the esophagus. The left and right crus were further mobilized with blunt dissection. I was then able to reapproximate the left and right crus with 0 Ethibond using an Endostitch suture device and securing it with a titanium tyknot. We then had the CRNA readvanced the calibration tubing back into the stomach. 10 mL of air was insufflated into the calibration tube balloon. The calibration tube was then gently pulled back and there was resistance at the GE junction. The tube did not slide back up into the esophagus. At this point the calibration tubing was deflated and removed from the patient's body.   The angle of Hiss was then mobilized with the harmonic scalpel. A 4 cm gastric pouch was then carefully measured along the lesser curve of the stomach. Dissection was carried along the lesser curve at this point with the Harmonic scalpel working carefully back toward the lesser sac at right angles to the lesser curve. The free lesser sac was then entered. After being sure all tubes were removed from the stomach an initial firing of the gold load 60 mm linear stapler was fired at right angles across the lesser curve for about 4 cm. The gastric pouch was further mobilized posteriorly and then the pouch was completed with 3 further firings of the 60 mm blue load linear stapler (the 3rd firing of the blue load of the staple cartridge was covered with a seamguard) up through the previously dissected angle of His. It was ensured that the pouch was completely mobilized away from the gastric remnant. This created a nice tubular 4-5 cm gastric pouch. The Roux limb was then brought up in an antecolic fashion with the candycane facing to the patient's left without undue tension. The gastrojejunostomy was  created with an initial posterior row of 2-0 Vicryl between the Roux limb and the staple line of the gastric pouch. Enterotomies were then made in the gastric pouch and the Roux limb with the harmonic scalpel and at approximately 2-2-1/2 cm anastomosis was created with a single firing of the 30mm blue load linear stapler. The staple line was inspected and was intact without bleeding. The common enterotomy was then closed with running 2-0 Vicryl begun at either end and tied centrally. The Ewall tube was then easily passed through the anastomosis and an outer anterior layer of running 2-0 Vicryl was placed. The Ewald tube was removed. With the outlet of the gastrojejunostomy clamped and under saline irrigation the assistant performed upper endoscopy and with the gastric pouch tensely distended with air-there was no evidence of leak on this test. The pouch was desufflated. The Terance Hart defect was closed with running 2-0 silk. The abdomen was inspected for any evidence of bleeding or bowel injury and everything looked fine. The Nathanson retractor was removed under direct vision after coating the anastomosis with Tisseel tissue sealant. All CO2 was evacuated and trochars removed. Skin incisions were closed with 4-0 monocryl in a subcuticular fashion followed by Dermabond. Sponge needle and instrument counts were correct. The patient was taken to the PACU in good condition.    Leighton Ruff. Redmond Pulling, MD, FACS General, Bariatric, & Minimally Invasive Surgery [facility], [place]

## 2016-03-16 NOTE — Anesthesia Preprocedure Evaluation (Signed)
Anesthesia Evaluation  Patient identified by MRN, date of birth, ID band Patient awake    Reviewed: Allergy & Precautions, NPO status , Patient's Chart, lab work & pertinent test results  Airway Mallampati: II  TM Distance: >3 FB Neck ROM: Full    Dental no notable dental hx.    Pulmonary neg pulmonary ROS,    Pulmonary exam normal breath sounds clear to auscultation       Cardiovascular negative cardio ROS Normal cardiovascular exam Rhythm:Regular Rate:Normal     Neuro/Psych negative neurological ROS  negative psych ROS   GI/Hepatic Neg liver ROS, GERD  ,  Endo/Other  Morbid obesity  Renal/GU negative Renal ROS  negative genitourinary   Musculoskeletal  (+) Arthritis ,   Abdominal (+) + obese,   Peds negative pediatric ROS (+)  Hematology negative hematology ROS (+)   Anesthesia Other Findings   Reproductive/Obstetrics negative OB ROS                             Anesthesia Physical Anesthesia Plan  ASA: III  Anesthesia Plan: General   Post-op Pain Management:    Induction: Intravenous  Airway Management Planned: Oral ETT  Additional Equipment:   Intra-op Plan:   Post-operative Plan: Extubation in OR  Informed Consent: I have reviewed the patients History and Physical, chart, labs and discussed the procedure including the risks, benefits and alternatives for the proposed anesthesia with the patient or authorized representative who has indicated his/her understanding and acceptance.   Dental advisory given  Plan Discussed with: CRNA  Anesthesia Plan Comments:         Anesthesia Quick Evaluation

## 2016-03-16 NOTE — Transfer of Care (Signed)
Immediate Anesthesia Transfer of Care Note  Patient: Matisyn Post  Procedure(s) Performed: Procedure(s): LAPAROSCOPIC ROUX-EN-Y GASTRIC BYPASS WITH HIATAL HERNIA REPAIR, UPPER ENDOSCOPY (N/A)  Patient Location: PACU  Anesthesia Type:General  Level of Consciousness: awake, alert  and oriented  Airway & Oxygen Therapy: Patient Spontanous Breathing and Patient connected to face mask oxygen  Post-op Assessment: Report given to RN and Post -op Vital signs reviewed and stable  Post vital signs: Reviewed and stable  Last Vitals:  Vitals:   03/16/16 0519  BP: (!) 124/97  Pulse: (!) 102  Resp: 18  Temp: 36.9 C    Last Pain:  Vitals:   03/16/16 0519  TempSrc: Oral      Patients Stated Pain Goal: 3 ([id number])  Complications: No apparent anesthesia complications

## 2016-03-16 NOTE — H&P (View-Only) (Signed)
Jenny Thornton 02/28/2016 1:22 PM Location: [facility] Patient #: [id number] DOB: 03-29-1956 Married / Language: English / Race: White Female  History of Present Illness Randall Hiss M. Nona Gracey MD; 02/28/2016 3:20 PM) Patient words: preop.  The patient is a 60 year old female who presents for a pre-op visit. She comes in today for her preoperative visit. I initially met her for weight loss surgery on July 14. Her weight at time was 254 pounds. She has been approved for laparoscopic Roux-en-Y gastric bypass with possible hiatal hernia repair. She denies any medical changes since I initially saw her in mid July. She denies a emergency room hospital. She denies any chest pain, chest pressure, shortness of breath, TIAs, amaurosis fugax, dysphagia exertion, orthopnea. She denies any dysphagia. She denies any reflux.  Her workup revealed a vitamin D deficiency. Her vitamin D level was 11. I put her on 50,000 international units of vitamin D once a week. She has completed this. She also was found to be prediabetic with a hemoglobin A1c of 6.1%. Otherwise her evaluation labs were within normal limits. Her chest x-ray and EKG were unremarkable.  Her GI showed a small hiatal hernia and a distal esophageal ring.   Problem List/Past Medical Randall Hiss Ronnie Derby, MD; 02/28/2016 3:26 PM) PREDIABETES (R73.03) MORBID OBESITY WITH BMI OF 40.0-44.9, ADULT (E66.01) VITAMIN D DEFICIENCY (E55.9) vit d was 42  Other Problems Gayland Curry, MD; 02/28/2016 3:26 PM) GASTROESOPHAGEAL REFLUX DISEASE, ESOPHAGITIS PRESENCE NOT SPECIFIED (K21.9) ARTHRITIS (M19.90) Migraine Headache Diverticulosis Cholelithiasis Kidney Stone Hemorrhoids Back Pain  Past Surgical History Gayland Curry, MD; 02/28/2016 3:26 PM) Gallbladder Surgery - Laparoscopic Cesarean Section - Multiple  Diagnostic Studies History Gayland Curry, MD; 02/28/2016 3:26 PM) Mammogram within last year Colonoscopy 1-5 years  ago Pap Smear 1-5 years ago  Allergies (Ammie Eversole, LPN; 4/78/2956 2:13 PM) Codeine Sulfate *ANALGESICS - OPIOID* Rash. Morphine Sulfate ER *ANALGESICS - OPIOID* Penicillin V *PENICILLINS* Rash.  Medication History Gayland Curry, MD; 02/28/2016 3:26 PM) Vitamin D (Ergocalciferol) (50000UNIT Capsule, 1 (one) Capsule Oral weekly, Taken starting 01/20/2016) Active. MethylPREDNISolone (4MG Tab Ther Pack, Oral) Active. B Complex 100 (Oral) Active. Cyclobenzaprine HCl (5MG Tablet, Oral) Active. LORazepam (0.5MG Tablet, Oral) Active. Fiber Formula (Oral) Active. Multiple Vitamins (Oral) Active. Vitamin D3 (2000UNIT Capsule, Oral) Active. Cyanocobalamin (500MCG Tablet ER, Oral) Active. Unisom (25MG Tablet, Oral) Active. Medications Reconciled OxyCODONE HCl (5MG/5ML Solution, 5-10 Milliliter Oral every four hours, as needed, Taken starting 02/28/2016) Active. Zofran ODT (4MG Tablet Disint, 1 (one) Tablet Disperse Oral every six hours, as needed, Taken starting 02/28/2016) Active. Pantoprazole Sodium (40MG Tablet DR, 1 (one) Tablet Oral daily, Taken starting 02/28/2016) Active.  Social History Gayland Curry, MD; 02/28/2016 3:26 PM) No drug use No alcohol use Tobacco use Never smoker.  Family History Gayland Curry, MD; 02/28/2016 3:26 PM) Cancer Mother. Alcohol Abuse Daughter, Family Members In General. Colon Polyps Mother. Cerebrovascular Accident Mother. Hypertension Father. Depression Daughter. Respiratory Condition Father. Prostate Cancer Father.  Pregnancy / Birth History Gayland Curry, MD; 02/28/2016 3:26 PM) Durenda Age 1 Contraceptive History Oral contraceptives. Length (months) of breastfeeding 3-6 Irregular periods Age of menopause [age] Age at menarche 8 years. Para 3     Review of Systems Randall Hiss M. Lacinda Curvin MD; 02/28/2016 3:18 PM) General Not Present- Appetite Loss, Chills, Fatigue, Fever, Night Sweats, Weight Gain and Weight  Loss. Skin Not Present- Change in Wart/Mole, Dryness, Hives, Jaundice, New Lesions, Non-Healing Wounds, Rash and Ulcer. HEENT Present- Ringing in the  Ears. Not Present- Earache, Hearing Loss, Hoarseness, Nose Bleed, Oral Ulcers, Seasonal Allergies, Sinus Pain, Sore Throat, Visual Disturbances, Wears glasses/contact lenses and Yellow Eyes. Respiratory Present- Snoring. Not Present- Bloody sputum, Chronic Cough, Difficulty Breathing and Wheezing. Breast Not Present- Breast Mass, Breast Pain, Nipple Discharge and Skin Changes. Cardiovascular Not Present- Chest Pain, Difficulty Breathing Lying Down, Leg Cramps, Palpitations, Rapid Heart Rate, Shortness of Breath and Swelling of Extremities. Gastrointestinal Present- Hemorrhoids and Indigestion. Not Present- Abdominal Pain, Bloating, Bloody Stool, Change in Bowel Habits, Chronic diarrhea, Constipation, Difficulty Swallowing, Excessive gas, Gets full quickly at meals, Nausea, Rectal Pain and Vomiting. Female Genitourinary Not Present- Frequency, Nocturia, Painful Urination, Pelvic Pain and Urgency. Musculoskeletal Present- Back Pain, Joint Pain, Joint Stiffness, Muscle Pain and Muscle Weakness. Not Present- Swelling of Extremities. Neurological Not Present- Decreased Memory, Fainting, Headaches, Numbness, Seizures, Tingling, Tremor, Trouble walking and Weakness. Psychiatric Not Present- Anxiety, Bipolar, Change in Sleep Pattern, Depression, Fearful and Frequent crying. Endocrine Not Present- Cold Intolerance, Excessive Hunger, Hair Changes, Heat Intolerance, Hot flashes and New Diabetes. Hematology Not Present- Blood Thinners, Easy Bruising, Excessive bleeding, Gland problems, HIV and Persistent Infections.  Vitals (Ammie Eversole LPN; 5/97/4163 8:45 PM) 02/28/2016 1:22 PM Weight: 258.2 lb Height: 65in Body Surface Area: 2.2 m Body Mass Index: 42.97 kg/m  Temp.: 98.68F(Oral)  Pulse: 100 (Regular)  BP: 126/82 (Sitting, Left Arm,  Standard)      Physical Exam Randall Hiss M. Brewster Wolters MD; 02/28/2016 3:18 PM)  General Mental Status-Alert. General Appearance-Consistent with stated age. Hydration-Well hydrated. Voice-Normal. Note: Morbidly obese  Head and Neck Head-normocephalic, atraumatic with no lesions or palpable masses. Trachea-midline. Thyroid Gland Characteristics - normal size and consistency.  Eye Eyeball - Bilateral-Extraocular movements intact. Sclera/Conjunctiva - Bilateral-No scleral icterus.  Chest and Lung Exam Chest and lung exam reveals -quiet, even and easy respiratory effort with no use of accessory muscles and on auscultation, normal breath sounds, no adventitious sounds and normal vocal resonance. Inspection Chest Wall - Normal. Back - normal.  Breast - Did not examine.  Cardiovascular Cardiovascular examination reveals -normal heart sounds, regular rate and rhythm with no murmurs and normal pedal pulses bilaterally.  Abdomen Inspection Inspection of the abdomen reveals - No Hernias. Skin - Scar - Note: Well-healed Pfannenstiel incision. Palpation/Percussion Palpation and Percussion of the abdomen reveal - Soft, Non Tender, No Rebound tenderness, No Rigidity (guarding) and No hepatosplenomegaly. Auscultation Auscultation of the abdomen reveals - Bowel sounds normal.  Peripheral Vascular Upper Extremity Palpation - Pulses bilaterally normal.  Neurologic Neurologic evaluation reveals -alert and oriented x 3 with no impairment of recent or remote memory. Mental Status-Normal.  Neuropsychiatric The patient's mood and affect are described as -normal. Judgment and Insight-insight is appropriate concerning matters relevant to self.  Musculoskeletal Normal Exam - Left-Upper Extremity Strength Normal and Lower Extremity Strength Normal. Normal Exam - Right-Upper Extremity Strength Normal and Lower Extremity Strength Normal.  Lymphatic Head &  Neck  General Head & Neck Lymphatics: Bilateral - Description - Normal. Axillary - Did not examine. Femoral & Inguinal - Did not examine.    Assessment & Plan Randall Hiss M. Abdurrahman Petersheim MD; 02/28/2016 3:25 PM)  MORBID OBESITY WITH BMI OF 40.0-44.9, ADULT (E66.01) Impression: We reviewed her preoperative workup. We discussed the results of her labs. We also discussed the results of her upper GI. It doesn't sound like she has any issues with food getting stuck. It does not sound like she has any issues with solids or liquids. She has trouble taking pills. We reviewed the  postoperative course. She was given prescriptions for pain, nausea, and heartburn medications. We discussed the importance of the preoperative diet plan. We also gave her her preoperative bowel instructions. I answered all of her questions she had. She is scheduled for laparoscopic Roux-en-Y gastric bypass with possible hiatal hernia repair in the near future  Current Plans Pt Education - EMW_preopbariatric Started OxyCODONE HCl 5MG/5ML, 5-10 Milliliter every four hours, as needed, 200 Milliliter, 02/28/2016, No Refill. Started Zofran ODT 4MG, 1 (one) Tablet Disperse every six hours, as needed, #20, 02/28/2016, No Refill. Started Pantoprazole Sodium 40MG, 1 (one) Tablet daily, #30, 30 days starting 02/28/2016, Ref. x3. VITAMIN D DEFICIENCY (E55.9) Story: vit d was 11 Impression: She completed 50,000 international units of vitamin D for 6 weeks. We will recheck her vitamin D level while she is in the hospital for surgery  GASTROESOPHAGEAL REFLUX DISEASE, ESOPHAGITIS PRESENCE NOT SPECIFIED (K21.9)  ARTHRITIS (M19.90)  PREDIABETES (R73.03)  Leighton Ruff. Redmond Pulling, MD, FACS General, Bariatric, & Minimally Invasive Surgery [facility], [place]

## 2016-03-16 NOTE — Anesthesia Postprocedure Evaluation (Signed)
Anesthesia Post Note  Patient: Jenny Thornton  Procedure(s) Performed: Procedure(s) (LRB): LAPAROSCOPIC ROUX-EN-Y GASTRIC BYPASS WITH HIATAL HERNIA REPAIR, UPPER ENDOSCOPY (N/A)  Patient location during evaluation: PACU Anesthesia Type: General Level of consciousness: awake and alert Pain management: pain level controlled Vital Signs Assessment: post-procedure vital signs reviewed and stable Respiratory status: spontaneous breathing, nonlabored ventilation, respiratory function stable and patient connected to nasal cannula oxygen Cardiovascular status: blood pressure returned to baseline and stable Postop Assessment: no signs of nausea or vomiting Anesthetic complications: no    Last Vitals:  Vitals:   03/16/16 1205 03/16/16 1303  BP: 137/75 135/67  Pulse: 71 77  Resp: 16 16  Temp: 36.7 C 37 C    Last Pain:  Vitals:   03/16/16 1303  TempSrc: Oral  PainSc:                  Brent Noto J

## 2016-03-16 NOTE — Op Note (Signed)
Preoperative diagnosis: Roux-en-Y gastric bypass  Postoperative diagnosis: Same   Procedure: Upper endoscopy   Surgeon: Clovis Riley, M.D.  Anesthesia: Gen.   Indications for procedure: This patient was undergoing a Roux-en-Y gastric bypass.   Description of procedure: The endoscopy was placed in the mouth and into the oropharynx and under endoscopic vision it was advanced to the esophagogastric junction. The pouch was insufflated and no bleeding or bubbles were seen. The GEJ was identified at 40cm from the lip. The anastomosis was at approximately 44cm from the lips No bleeding or leaks were detected. The scope was withdrawn without difficulty.   Clovis Riley, M.D. General, Bariatric, & Minimally Invasive Surgery [facility] [place]

## 2016-03-16 NOTE — Anesthesia Procedure Notes (Signed)
Procedure Name: Intubation Date/Time: 03/16/2016 7:32 AM Performed by: Noralyn Pick D Pre-anesthesia Checklist: Patient identified, Emergency Drugs available, Suction available and Patient being monitored Patient Re-evaluated:Patient Re-evaluated prior to inductionOxygen Delivery Method: Circle system utilized Preoxygenation: Pre-oxygenation with 100% oxygen Intubation Type: IV induction Ventilation: Mask ventilation without difficulty Laryngoscope Size: Mac and 3 Grade View: Grade I Tube type: Oral Tube size: 7.5 mm Number of attempts: 1 Airway Equipment and Method: Stylet Placement Confirmation: ETT inserted through vocal cords under direct vision,  positive ETCO2 and breath sounds checked- equal and bilateral Tube secured with: Tape Dental Injury: Teeth and Oropharynx as per pre-operative assessment

## 2016-03-17 ENCOUNTER — Inpatient Hospital Stay (HOSPITAL_COMMUNITY): Payer: BC Managed Care – PPO

## 2016-03-17 LAB — COMPREHENSIVE METABOLIC PANEL
ALT: 107 U/L — AB (ref 14–54)
ANION GAP: 5 (ref 5–15)
AST: 85 U/L — ABNORMAL HIGH (ref 15–41)
Albumin: 4.1 g/dL (ref 3.5–5.0)
Alkaline Phosphatase: 57 U/L (ref 38–126)
BUN: 8 mg/dL (ref 6–20)
CHLORIDE: 108 mmol/L (ref 101–111)
CO2: 26 mmol/L (ref 22–32)
CREATININE: 0.77 mg/dL (ref 0.44–1.00)
Calcium: 9 mg/dL (ref 8.9–10.3)
Glucose, Bld: 126 mg/dL — ABNORMAL HIGH (ref 65–99)
Potassium: 4.3 mmol/L (ref 3.5–5.1)
SODIUM: 139 mmol/L (ref 135–145)
Total Bilirubin: 0.6 mg/dL (ref 0.3–1.2)
Total Protein: 7.2 g/dL (ref 6.5–8.1)

## 2016-03-17 LAB — CBC WITH DIFFERENTIAL/PLATELET
Basophils Absolute: 0 10*3/uL (ref 0.0–0.1)
Basophils Relative: 0 %
EOS ABS: 0 10*3/uL (ref 0.0–0.7)
EOS PCT: 0 %
HCT: 40.7 % (ref 36.0–46.0)
Hemoglobin: 13.6 g/dL (ref 12.0–15.0)
LYMPHS ABS: 1.3 10*3/uL (ref 0.7–4.0)
Lymphocytes Relative: 11 %
MCH: 29.6 pg (ref 26.0–34.0)
MCHC: 33.4 g/dL (ref 30.0–36.0)
MCV: 88.7 fL (ref 78.0–100.0)
MONO ABS: 1.5 10*3/uL — AB (ref 0.1–1.0)
MONOS PCT: 13 %
NEUTROS ABS: 9 10*3/uL — AB (ref 1.7–7.7)
NEUTROS PCT: 76 %
PLATELETS: 234 10*3/uL (ref 150–400)
RBC: 4.59 MIL/uL (ref 3.87–5.11)
RDW: 13.4 % (ref 11.5–15.5)
WBC: 11.7 10*3/uL — ABNORMAL HIGH (ref 4.0–10.5)

## 2016-03-17 LAB — HEMOGLOBIN AND HEMATOCRIT, BLOOD
HEMATOCRIT: 38.6 % (ref 36.0–46.0)
HEMOGLOBIN: 13 g/dL (ref 12.0–15.0)

## 2016-03-17 MED ORDER — IOPAMIDOL (ISOVUE-300) INJECTION 61%
50.0000 mL | Freq: Once | INTRAVENOUS | Status: AC | PRN
  Administered 2016-03-17: 50 mL via ORAL
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
## 2016-03-17 NOTE — Plan of Care (Signed)
Problem: Food- and Nutrition-Related Knowledge Deficit (NB-1.1) Goal: Nutrition education Formal process to instruct or train a patient/client in a skill or to impart knowledge to help patients/clients voluntarily manage or modify food choices and eating behavior to maintain or improve health. Outcome: Completed/Met Date Met: 03/17/16 Nutrition Education Note  Received consult for diet education per DROP protocol.   Discussed 2 week post op diet with pt. Emphasized that liquids must be non carbonated, non caffeinated, and sugar free. Fluid goals discussed. Reviewed progression of diet to include soft proteins at 7-10 days post-op. Pt to follow up with outpatient bariatric RD for further diet progression after 2 weeks. Multivitamins and minerals also reviewed. Teach back method used, pt expressed understanding, expect good compliance.   Diet: First 2 Weeks  You will see the dietitian about two (2) weeks after your surgery. The dietitian will increase the types of foods you can eat if you are handling liquids well:  If you have severe vomiting or nausea and cannot handle clear liquids lasting longer than 1 day, call your surgeon  Protein Shake  Drink at least 2 ounces of shake 5-6 times per day  Each serving of protein shakes (usually 8 - 12 ounces) should have a minimum of:  15 grams of protein  And no more than 5 grams of carbohydrate  Goal for protein each day:  Men = 80 grams per day  Women = 60 grams per day  Protein powder may be added to fluids such as non-fat milk or Lactaid milk or Soy milk (limit to 35 grams added protein powder per serving)   Hydration  Slowly increase the amount of water and other clear liquids as tolerated (See Acceptable Fluids)  Slowly increase the amount of protein shake as tolerated  Sip fluids slowly and throughout the day  May use sugar substitutes in small amounts (no more than 6 - 8 packets per day; i.e. Splenda)   Fluid Goal  The first goal is to  drink at least 8 ounces of protein shake/drink per day (or as directed by the nutritionist); some examples of protein shakes are [employer] Johnson, AMR Corporation, EAS Edge HP, and Unjury. See handout from pre-op Bariatric Education Class:  Slowly increase the amount of protein shake you drink as tolerated  You may find it easier to slowly sip shakes throughout the day  It is important to get your proteins in first  Your fluid goal is to drink 64 - 100 ounces of fluid daily  It may take a few weeks to build up to this  32 oz (or more) should be clear liquids  And  32 oz (or more) should be full liquids (see below for examples)  Liquids should not contain sugar, caffeine, or carbonation   Clear Liquids:  Water or Sugar-free flavored water (i.e. Fruit H2O, Propel)  Decaffeinated coffee or tea (sugar-free)  Crystal Lite, Wyler's Lite, Minute Maid Lite  Sugar-free Jell-O  Bouillon or broth  Sugar-free Popsicle: *Less than 20 calories each; Limit 1 per day   Full Liquids:  Protein Shakes/Drinks + 2 choices per day of other full liquids  Full liquids must be:  No More Than 12 grams of Carbs per serving  No More Than 3 grams of Fat per serving  Strained low-fat cream soup  Non-Fat milk  Fat-free Lactaid Milk  Sugar-free yogurt (Dannon Lite & Fit, Greek yogurt)     Clayton Bibles, MS, RD, LDN Pager: [phone] After Hours Pager: [phone]

## 2016-03-17 NOTE — Progress Notes (Signed)
Patient ID: Jenny Thornton, female   DOB: October 13, 1955, 60 y.o.   MRN: [id number]  Progress Note: Metabolic and Bariatric Surgery Service   Subjective: Slept ok. Not much nausea. Pain ok. Has some facial flushing this am. No issues with fentanyl.   Objective: Vital signs in last 24 hours: Temp:  [98 F (36.7 C)-99.3 F (37.4 C)] 98.3 F (36.8 C) (10/03 0531) Pulse Rate:  [63-88] 63 (10/03 0531) Resp:  [12-21] 18 (10/03 0531) BP: (124-145)/(58-87) 145/67 (10/03 0531) SpO2:  [94 %-100 %] 98 % (10/03 0531) Weight:  [118.8 kg (261 lb 14.4 oz)] 118.8 kg (261 lb 14.4 oz) (10/03 0531) Last BM Date: 03/16/16  Intake/Output from previous day: 10/02 0701 - 10/03 0700 In: 2337.5 [I.V.:2187.5; IV Piggyback:150] Out: 2980 [Urine:2925; Emesis/NG output:5; Blood:50] Intake/Output this shift: No intake/output data recorded.  Lungs: cta  Cardiovascular: reg  Abd: soft, nd, mild approp TTP  Extremities: no edema, +SCDs;  Neuro: alert, ox3, approp, sitting in chair  Lab Results: CBC   Recent Labs  03/16/16 1116 03/17/16 0504  WBC  --  11.7*  HGB 14.2 13.6  HCT 42.3 40.7  PLT  --  234   BMET  Recent Labs  03/17/16 0504  NA 139  K 4.3  CL 108  CO2 26  GLUCOSE 126*  BUN 8  CREATININE 0.77  CALCIUM 9.0   PT/INR No results for input(s): LABPROT, INR in the last 72 hours. ABG No results for input(s): PHART, HCO3 in the last 72 hours.  Invalid input(s): PCO2, PO2  Studies/Results:  Anti-infectives: Anti-infectives    Start     Dose/Rate Route Frequency Ordered Stop   03/16/16 0518  levofloxacin (LEVAQUIN) IVPB 750 mg     750 mg 100 mL/hr over 90 Minutes Intravenous On call to O.R. 03/16/16 0518 03/16/16 [id number]      Medications: Scheduled Meds: . enoxaparin (LOVENOX) injection  30 mg Subcutaneous Q12H  . famotidine (PEPCID) IV  20 mg Intravenous Q12H  . [START ON 03/18/2016] protein supplement shake  2 oz Oral Q2H  . scopolamine  1 patch Transdermal Q72H    Continuous Infusions: . dextrose 5 % and 0.45 % NaCl with KCl 20 mEq/L 125 mL/hr at 03/17/16 0614   PRN Meds:.oxyCODONE **AND** acetaminophen, acetaminophen (TYLENOL) oral liquid 160 mg/5 mL, diphenhydrAMINE, fentaNYL (SUBLIMAZE) injection, ondansetron (ZOFRAN) IV, promethazine  Assessment/Plan: Patient Active Problem List   Diagnosis Date Noted  . Obesity, Class III, BMI 40-49.9 (morbid obesity) ([place]) 03/16/2016  . Hiatal hernia 03/16/2016  . Prediabetes 03/16/2016  . Osteoarthritis 03/16/2016  . GERD (gastroesophageal reflux disease) 03/16/2016  . S/P gastric bypass 03/16/2016   s/p Procedure(s): LAPAROSCOPIC ROUX-EN-Y GASTRIC BYPASS WITH HIATAL HERNIA REPAIR, UPPER ENDOSCOPY 03/16/2016  Looks good. No fever. No tachycardia. For swallow this am. If ok, start POD 1 diet Not sure what flushing is due from. Will monitor. No signs of rash Cont chemical vte prophylaxis Ambulate  Leighton Ruff. Redmond Pulling, MD, FACS General, Bariatric, & Minimally Invasive Surgery [facility]   Disposition:  LOS: 1 day  The patient will be in the hospital for normal postop protocol  Gayland Curry, MD [phone] [facility]

## 2016-03-17 NOTE — Progress Notes (Signed)
Order received via phone from Dr. Delma Post ET CO2 monitoring.

## 2016-03-18 LAB — CBC WITH DIFFERENTIAL/PLATELET
BASOS ABS: 0 10*3/uL (ref 0.0–0.1)
BASOS PCT: 0 %
EOS PCT: 1 %
Eosinophils Absolute: 0.1 10*3/uL (ref 0.0–0.7)
HEMATOCRIT: 37.6 % (ref 36.0–46.0)
HEMOGLOBIN: 12.7 g/dL (ref 12.0–15.0)
Lymphocytes Relative: 26 %
Lymphs Abs: 2.1 10*3/uL (ref 0.7–4.0)
MCH: 30.2 pg (ref 26.0–34.0)
MCHC: 33.8 g/dL (ref 30.0–36.0)
MCV: 89.3 fL (ref 78.0–100.0)
MONO ABS: 0.8 10*3/uL (ref 0.1–1.0)
MONOS PCT: 10 %
Neutro Abs: 5.1 10*3/uL (ref 1.7–7.7)
Neutrophils Relative %: 63 %
Platelets: 189 10*3/uL (ref 150–400)
RBC: 4.21 MIL/uL (ref 3.87–5.11)
RDW: 13.7 % (ref 11.5–15.5)
WBC: 8.1 10*3/uL (ref 4.0–10.5)

## 2016-03-18 MED ORDER — HYDROCODONE-ACETAMINOPHEN 7.5-325 MG/15ML PO SOLN: 10.0000 mL | Freq: Four times a day (QID) | ORAL | 0 refills | Status: DC | PRN

## 2016-03-18 MED ORDER — HYDROCODONE-ACETAMINOPHEN 7.5-325 MG/15ML PO SOLN: 10.0000 mL | Freq: Four times a day (QID) | ORAL | Status: DC | PRN

## 2016-03-18 NOTE — Discharge Instructions (Signed)

## 2016-03-18 NOTE — Progress Notes (Signed)
Patient stated that fentanyl may be too strong for her.  Notified Dr . Kieth Brightly regarding patient preference for IV tylenol because she is NPO.  MD stated that She can take PO tylenol instead.  Will notify patient of this.

## 2016-03-18 NOTE — Discharge Summary (Signed)
Physician Discharge Summary  Jenny Thornton [id number] DOB: Mar 29, 1956 DOA: 03/16/2016  PCP: Sheila Oats, MD  Admit date: 03/16/2016 Discharge date: 03/18/2016  Recommendations for Outpatient Follow-up:    Follow-up Information    Gayland Curry, MD. Go on 04/02/2016.   Specialty:  General Surgery Why:  at 4:30 PM for post-op check Contact information: [address] [place] [address] [phone]        Tray Klayman M, MD .   Specialty:  General Surgery Contact information: [place] [address] [phone]          Discharge Diagnoses:  Principal Problem:   Obesity, Class III, BMI 40-49.9 (morbid obesity) ([place]) Active Problems:   Hiatal hernia   Prediabetes   Osteoarthritis   GERD (gastroesophageal reflux disease)   S/P gastric bypass   Surgical Procedure: Laparoscopic Roux-en-Y gastric bypass with hiatal hernia repair, upper endoscopy  Discharge Condition: Good Disposition: Home  Diet recommendation: Postoperative gastric bypass diet  Filed Weights   03/16/16 0519 03/17/16 0531 03/18/16 0629  Weight: 112.2 kg (247 lb 6 oz) 118.8 kg (261 lb 14.4 oz) 114.4 kg (252 lb 3.2 oz)     Hospital Course:  The patient was admitted for a planned laparoscopic Roux-en-Y gastric bypass. Please see operative note. Preoperatively the patient was given 5000 units of subcutaneous heparin for DVT prophylaxis. Postoperative prophylactic Lovenox dosing was started on the morning of postoperative day 1. The patient underwent an upper GI on postoperative day 1 which demonstrated no extravasation of contrast and emptying of the contrast into the Roux limb. The patient was started on ice chips and water which they tolerated. She did develop some hives with oral oxycodone so that was stopped. On postoperative day 2 The patient's diet was advanced to protein shakes which they also tolerated. She was switched oral lortab for pain in addition to oral  tylenol. The patient was ambulating without difficulty. Their vital signs are stable without fever or tachycardia. Their hemoglobin had remained stable. The patient had received discharge instructions and counseling. They were deemed stable for discharge.  She felt good this am. Tolerated water. Starting on shake. Ambulated. Min to nausea  BP (!) 142/68 (BP Location: Right Arm)   Pulse 68   Temp 99 F (37.2 C) (Oral)   Resp 16   Ht 5\' 5"  (1.651 m)   Wt 114.4 kg (252 lb 3.2 oz)   SpO2 98%   BMI 41.97 kg/m   Gen: alert, NAD, non-toxic appearing Pupils: equal, no scleral icterus Pulm: Lungs clear to auscultation, symmetric chest rise CV: regular rate and rhythm Abd: soft, nontender, nondistended. Well-healed trocar sites. No cellulitis. No incisional hernia Ext: no edema, no calf tenderness Skin: mild redness on face, no jaundice   Discharge Instructions  Discharge Instructions    Ambulate hourly while awake    Complete by:  As directed    Call MD for:  difficulty breathing, headache or visual disturbances    Complete by:  As directed    Call MD for:  persistant dizziness or light-headedness    Complete by:  As directed    Call MD for:  persistant nausea and vomiting    Complete by:  As directed    Call MD for:  redness, tenderness, or signs of infection (pain, swelling, redness, odor or green/yellow discharge around incision site)    Complete by:  As directed    Call MD for:  severe uncontrolled pain  Complete by:  As directed    Call MD for:  temperature >101 F    Complete by:  As directed    Diet bariatric full liquid    Complete by:  As directed    Discharge instructions    Complete by:  As directed    See bariatric discharge instructions   Incentive spirometry    Complete by:  As directed    Perform hourly while awake       Medication List    STOP taking these medications   celecoxib 200 MG capsule Commonly known as:  CELEBREX   doxylamine (Sleep) 25 MG  tablet Commonly known as:  UNISOM     TAKE these medications   b complex vitamins tablet Take 1 tablet by mouth daily.   cyanocobalamin 500 MCG tablet Take 500 mcg by mouth daily.   cyclobenzaprine 5 MG tablet Commonly known as:  FLEXERIL Take 5 mg by mouth 3 (three) times daily as needed for muscle spasms.   FIBER PO Take 1 tablet by mouth daily.   HYDROcodone-acetaminophen 7.5-325 mg/15 ml solution Commonly known as:  HYCET Take 10 mLs by mouth every 6 (six) hours as needed for moderate pain.   LORazepam 0.5 MG tablet Commonly known as:  ATIVAN Take 0.5 mg by mouth every 8 (eight) hours as needed for anxiety.   Melatonin 10 MG Tabs Take 10 % by mouth at bedtime.   multivitamin with minerals tablet Take 1 tablet by mouth daily.   Vitamin D 2000 units Caps Take 2,000 Units by mouth daily.      Follow-up Information    Gayland Curry, MD. Go on 04/02/2016.   Specialty:  General Surgery Why:  at 4:30 PM for post-op check Contact information: [address] [place] [address] [phone]        Deneka Greenwalt M, MD .   Specialty:  General Surgery Contact information: [place] [address] [phone]            The results of significant diagnostics from this hospitalization (including imaging, microbiology, ancillary and laboratory) are listed below for reference.    Significant Diagnostic Studies: Dg Ugi W/water Sol Cm  Result Date: 03/17/2016 CLINICAL DATA:  Roux-en-y gastrojejunostomy with hiatal hernia repair. EXAM: WATER SOLUBLE UPPER GI SERIES TECHNIQUE: Single-column upper GI series was performed using water soluble contrast. CONTRAST:  5mL ISOVUE-300 IOPAMIDOL (ISOVUE-300) INJECTION 61% COMPARISON:  01/14/2016 FLUOROSCOPY TIME:  Fluoroscopy Time:  1 minutes, 48 seconds Radiation Exposure Index (if provided by the fluoroscopic device): 35.2 mGy Number of Acquired Spot Images: 0 FINDINGS: Initial KUB  demonstrates stable right nephrolithiasis and cholecystectomy clips. Postoperative findings along the proximal stomach noted with staple lines. Today' s exam was focused on the distal esophagus, stomach, and proximal bowel. No current hiatal hernia. Expected appearance of gastric pouch and gastrojejunostomy with maximum caliber of the gastrojejunostomy about 45 mm. No leak. Expected filling of the jejunum with normal jejunal caliber. Contrast was allowed to extends distally within the small bowel, without evidence of a complication in the expected region of the distal anastomosis. IMPRESSION: 1. Roux-en-y gastrojejunostomy, without leak or complicating feature on today's postoperative day 1 assessment. 2. Stable right nephrolithiasis. Electronically Signed   By: Van Clines M.D.   On: 03/17/2016 09:26    Labs: Basic Metabolic Panel:  Recent Labs Lab 03/17/16 0504  NA 139  K 4.3  CL 108  CO2 26  GLUCOSE 126*  BUN 8  CREATININE 0.77  CALCIUM 9.0   Liver Function Tests:  Recent Labs Lab 03/17/16 0504  AST 85*  ALT 107*  ALKPHOS 57  BILITOT 0.6  PROT 7.2  ALBUMIN 4.1    CBC:  Recent Labs Lab 03/16/16 1116 03/17/16 0504 03/17/16 1619 03/18/16 0515  WBC  --  11.7*  --  8.1  NEUTROABS  --  9.0*  --  5.1  HGB 14.2 13.6 13.0 12.7  HCT 42.3 40.7 38.6 37.6  MCV  --  88.7  --  89.3  PLT  --  234  --  189    CBG: No results for input(s): GLUCAP in the last 168 hours.  Principal Problem:   Obesity, Class III, BMI 40-49.9 (morbid obesity) ([facility]) Active Problems:   Hiatal hernia   Prediabetes   Osteoarthritis   GERD (gastroesophageal reflux disease)   S/P gastric bypass   Time coordinating discharge: 20 minutes  Signed:  Gayland Curry, MD [facility] [place] [phone] 03/18/2016, 8:10 AM

## 2016-03-18 NOTE — Progress Notes (Signed)
Discharge planning, no HH needs identified. [phone] 

## 2016-03-18 NOTE — Progress Notes (Signed)
Patient alert and oriented, pain is controlled. Patient is tolerating fluids,  advanced to protein shake today, patient tolerated well. Reviewed Gastric Bypass discharge instructions with patient and patient is able to articulate understanding. Provided information on BELT program, Support Group and [facility] outpatient pharmacy. All questions answered, will continue to monitor.    

## 2016-03-24 ENCOUNTER — Encounter: Payer: BC Managed Care – PPO | Attending: General Surgery | Admitting: Dietician

## 2016-03-24 DIAGNOSIS — Z713 Dietary counseling and surveillance: Secondary | ICD-10-CM | POA: Insufficient documentation

## 2016-03-24 DIAGNOSIS — Z01818 Encounter for other preprocedural examination: Secondary | ICD-10-CM | POA: Diagnosis not present

## 2016-03-25 NOTE — Progress Notes (Signed)
Bariatric Class:  Appt start time: 1530 end time:  1630.  2 Week Post-Operative Nutrition Class  Patient was seen on 03/24/2016 for Post-[facility]ative [facility]   Surgery date: 03/16/2016 Surgery type: RYGB Start weight at [facility]: 256 lbs on 01/16/2016 Weight today: 242.0 lbs  Weight change: 19.8 lbs  TANITA  BODY COMP RESULTS  02/24/16 03/24/16   BMI (kg/m^2) 43.6 40.3   Fat Mass (lbs) 134.6 124.6   Fat Free Mass (lbs) 127.2 117.4   Total Body Water (lbs) 93.2 85.4   The following the learning objectives were met by the patient during this course:  Identifies Phase 3A (Soft, High Proteins) Dietary Goals and will begin from 2 weeks post-operatively to 2 months post-operatively  Identifies appropriate sources of fluids and proteins   States protein recommendations and appropriate sources post-operatively  Identifies the need for appropriate texture modifications, mastication, and bite sizes when consuming solids  Identifies appropriate multivitamin and calcium sources post-operatively  Describes the need for physical activity post-operatively and will follow MD recommendations  States when to call healthcare provider regarding medication questions or post-operative complications  Handouts given during class include:  Phase 3A: Soft, High Protein Diet Handout  Follow-Up Plan: Patient will follow-up at [facility] in 6 weeks for 2 month post-op nutrition visit for diet advancement per MD.

## 2016-03-31 ENCOUNTER — Ambulatory Visit: Payer: BC Managed Care – PPO

## 2016-05-06 ENCOUNTER — Encounter: Payer: BC Managed Care – PPO | Attending: General Surgery | Admitting: Dietician

## 2016-05-06 ENCOUNTER — Encounter: Payer: Self-pay | Admitting: Dietician

## 2016-05-06 DIAGNOSIS — Z713 Dietary counseling and surveillance: Secondary | ICD-10-CM | POA: Insufficient documentation

## 2016-05-06 DIAGNOSIS — Z01818 Encounter for other preprocedural examination: Secondary | ICD-10-CM | POA: Insufficient documentation

## 2016-05-06 NOTE — Progress Notes (Signed)
  Follow-up visit:  7 Weeks Post-Operative RYGB Surgery  Medical Nutrition Therapy:  Appt start time: 1025 end time:  1100.  Primary concerns today: Post-operative Bariatric Surgery Nutrition Management. Jenny Thornton returns having lost a total of 38 lbs. She has had some nausea with [place] sausage and states that sometimes meat does not "settle right." No vomiting. Noticed some taste changes. Tolerates non-meat proteins better: eggs, cheese, beans. Using [employer].   Surgery date: 03/16/2016 Surgery type: RYGB Start weight at [facility]: 256 lbs on 01/16/2016 (261 lbs per patient) Weight today: 223 lbs Weight change: 19 lbs Total weight lost: 38 lbs  TANITA  BODY COMP RESULTS  02/24/16 03/24/16 05/06/16   BMI (kg/m^2) 43.6 40.3 37.1   Fat Mass (lbs) 134.6 124.6 111.8   Fat Free Mass (lbs) 127.2 117.4 111.2   Total Body Water (lbs) 93.2 85.4 80.2    Preferred Learning Style:   No preference indicated   Learning Readiness:   Ready  24-hr recall: B (AM): Premier protein shake (30g) Snk (AM): cheese stick (6g)  L (PM): lunchmeat and cheese (10-14g) Snk (PM): cottage cheese or yogurt (7-12g)  D (PM): chili (10-14g) Snk (PM):   Fluid intake: 45-50 oz protein shake, decaf coffee, water  Estimated total protein intake: 60-75g/day  Medications: see list Supplementation: taking  Using straws: no Drinking while eating: no Hair loss: no Carbonated beverages: no N/V/D/C: constipation, takes fiber supplement, some diarrhea recently Dumping syndrome: none  Recent physical activity:  Started walking recently 2x a week for 15 minutes   Progress Towards Goal(s):  In progress.  Handouts given during visit include:  Phase 3B lean protein + non starchy vegetables   Nutritional Diagnosis:  [place]-3.3 Overweight/obesity related to past poor dietary habits and physical inactivity as evidenced by patient w/ recent RYGB surgery following dietary guidelines for continued weight loss.      Intervention:  Nutrition counseling provided.  Teaching Method Utilized:  Visual Auditory Hands on  Barriers to learning/adherence to lifestyle change: none  Demonstrated degree of understanding via:  Teach Back   Monitoring/Evaluation:  Dietary intake, exercise, and body weight. Follow up prn. Patient agrees to call [facility] for follow up appointment.

## 2016-05-06 NOTE — Patient Instructions (Addendum)
Goals:  Follow Phase 3B: High Protein + Non-Starchy Vegetables  Eat 3-6 small meals/snacks, every 3-5 hrs  Increase lean protein foods to meet 60g goal  Increase fluid intake to 64oz +  Avoid drinking 15 minutes before, during and 30 minutes after eating  Aim for >30 min of physical activity daily  Keep experimenting with different protein foods  Surgery date: 03/16/2016 Surgery type: RYGB Start weight at [facility]: 256 lbs on 01/16/2016 (261 lbs per patient) Weight today: 223 lbs Weight change: 19 lbs Total weight lost: 38 lbs  TANITA  BODY COMP RESULTS  02/24/16 03/24/16 05/06/16   BMI (kg/m^2) 43.6 40.3 37.1   Fat Mass (lbs) 134.6 124.6 111.8   Fat Free Mass (lbs) 127.2 117.4 111.2   Total Body Water (lbs) 93.2 85.4 80.2

## 2016-10-29 ENCOUNTER — Encounter: Payer: BC Managed Care – PPO | Attending: General Surgery | Admitting: Skilled Nursing Facility1

## 2016-10-29 ENCOUNTER — Encounter: Payer: Self-pay | Admitting: Skilled Nursing Facility1

## 2016-10-29 DIAGNOSIS — K219 Gastro-esophageal reflux disease without esophagitis: Secondary | ICD-10-CM | POA: Diagnosis not present

## 2016-10-29 DIAGNOSIS — R7303 Prediabetes: Secondary | ICD-10-CM | POA: Diagnosis not present

## 2016-10-29 DIAGNOSIS — E669 Obesity, unspecified: Secondary | ICD-10-CM | POA: Insufficient documentation

## 2016-10-29 DIAGNOSIS — Z9884 Bariatric surgery status: Secondary | ICD-10-CM | POA: Diagnosis not present

## 2016-10-29 DIAGNOSIS — Z713 Dietary counseling and surveillance: Secondary | ICD-10-CM | POA: Diagnosis not present

## 2016-10-29 NOTE — Progress Notes (Signed)
  Follow-up visit:  7 Weeks Post-Operative RYGB Surgery  Medical Nutrition Therapy:  Appt start time: 1025 end time:  1100.  Primary concerns today: Post-operative Bariatric Surgery Nutrition Management. Pt states she always tries to get in one protein shake just for fear of not getting enough protein. Pt states she is having pain in her bladder which may be due to stones.    Surgery date: 03/16/2016 Surgery type: RYGB Start weight at [facility]: 256 lbs on 01/16/2016 (261 lbs per patient) Weight today: 199.4 lbs Weight change: 23.6 lbs Total weight lost: 61.6 lbs  TANITA  BODY COMP RESULTS  02/24/16 03/24/16 05/06/16 10/29/2016   BMI (kg/m^2) 43.6 40.3 37.1    Fat Mass (lbs) 134.6 124.6 111.8 90.8   Fat Free Mass (lbs) 127.2 117.4 111.2 108.6   Total Body Water (lbs) 93.2 85.4 80.2 77.4    Preferred Learning Style:   No preference indicated   Learning Readiness:   Ready  24-hr recall: B (AM): cottage cheese with bean sausage patty Snk (AM): protein shake  L (PM): 1/2 pimento cheese sandwich (10-14g) Snk (PM): piece of candy D (PM): lean cuisine or garden Engineering geologist (PM): popcorn  Fluid intake: 11 oz protein shake, 8 oz decaf coffee, 32 water: 51-64 Estimated total protein intake: 60-75g/day  Medications: see list Supplementation: taking multi and forgetting 3 calcium   Using straws: no Drinking while eating: no Hair loss: no Carbonated beverages: no N/V/D/C: constipation, takes fiber supplement, a little nasusea with some foods  Dumping syndrome: none   Recent physical activity:  Started walking recently 2x a week for 15 minutes   Progress Towards Goal(s):  In progress.  Handouts given during visit include:  Phase 3B lean protein + non starchy vegetables   Nutritional Diagnosis:  [place]-3.3 Overweight/obesity related to past poor dietary habits and physical inactivity as evidenced by patient w/ recent RYGB surgery following dietary guidelines for continued weight  loss.  Intervention:  Nutrition counseling provided. Goals: -Try to start doing weight bearing/resistance exercises 1-2 times a week -Try kefir in the yogurt aisle  -aim to get 64+ fluid ounces every day  Teaching Method Utilized:  Visual Auditory Hands on  Barriers to learning/adherence to lifestyle change: none  Demonstrated degree of understanding via:  Teach Back   Monitoring/Evaluation:  Dietary intake, exercise, and body weight. Follow up prn. Patient agrees to call [facility] for follow up appointment.

## 2016-10-29 NOTE — Patient Instructions (Addendum)
-  Try to start doing weight bearing/resistance exercises 1-2 times a week  -Try kefir in the yogurt aisle   -aim to get 64+ fluid ounces every day

## 2018-04-14 IMAGING — RF DG UGI W/ GASTROGRAFIN
6 of 7 series · 12 of 24 positions shown · IV contrast (iopamidol)
Comparison: 01/14/2016

CLINICAL DATA: Roux-en-y gastrojejunostomy with hiatal hernia
repair.

EXAM:
WATER SOLUBLE UPPER GI SERIES
TECHNIQUE: Single-column upper GI series was performed using water soluble
contrast.
CONTRAST:  50mL 8IUNAV-JJJ IOPAMIDOL (8IUNAV-JJJ) INJECTION 61%

[Series 2: cp_standard · 0.34mm/px · 2 of 65 frames shown (1 of 6)]
[frame 10/65]
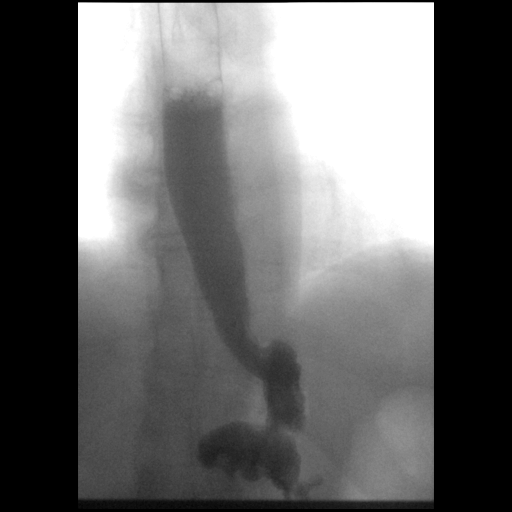
[frame 45/65]
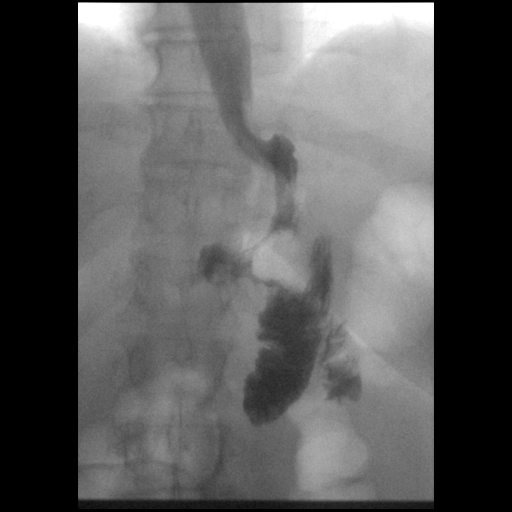

[Series 3: cp_standard · 0.34mm/px · 2 of 71 frames shown (2 of 6)]
[frame 8/71]
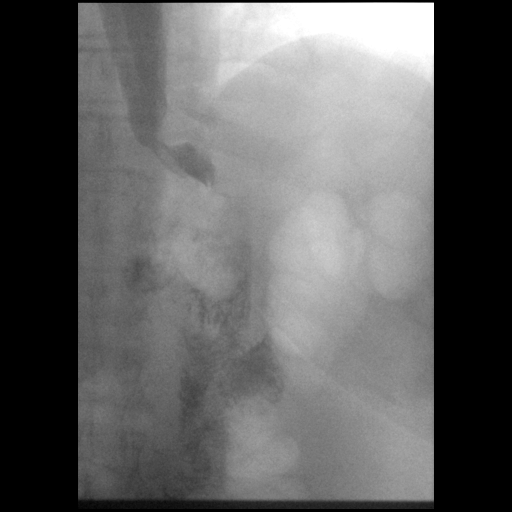
[frame 36/71]
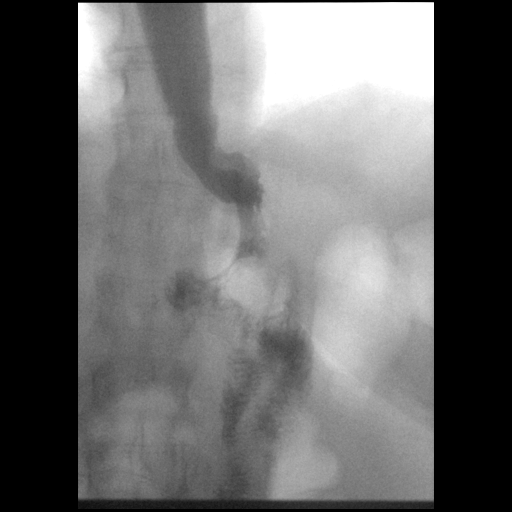

[Series 4: cp_standard · 0.34mm/px · 2 of 64 frames shown (3 of 6)]
[frame 10/64]
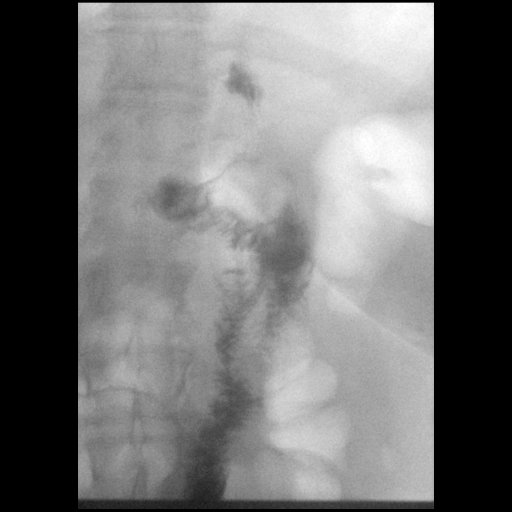
[frame 47/64]
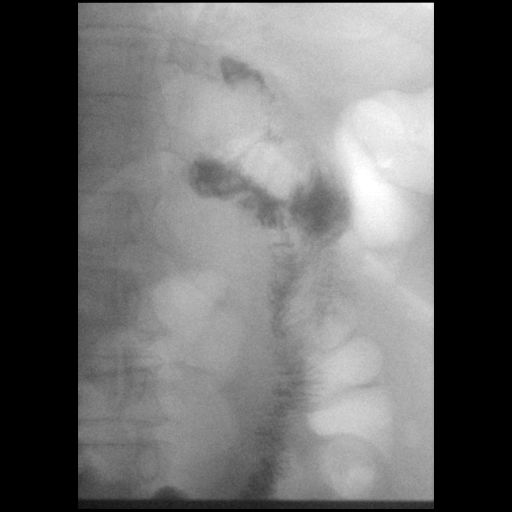

[Series 5: cp_standard · 0.34mm/px · 2 of 57 frames shown (4 of 6)]
[frame 9/57]
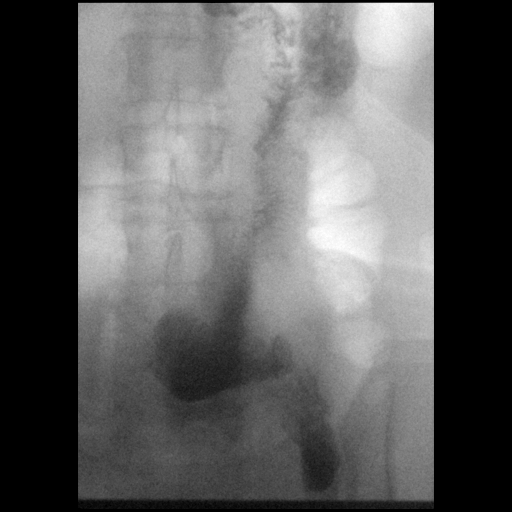
[frame 49/57]
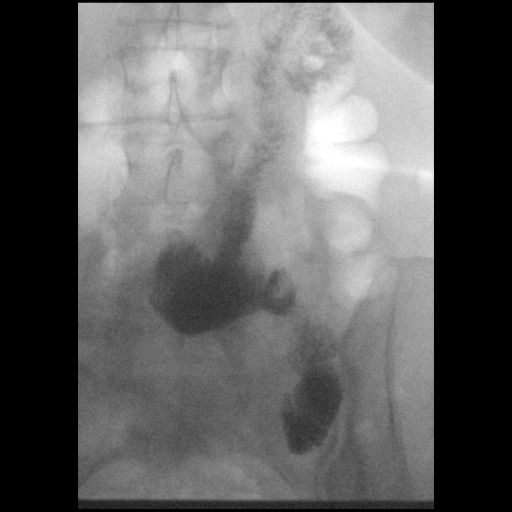

[Series 6: cp_standard · 0.34mm/px · 2 of 19 frames shown (5 of 6)]
[frame 10/19]
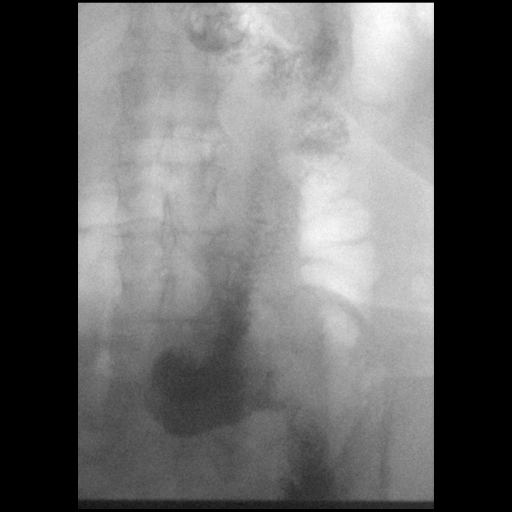
[frame 17/19]
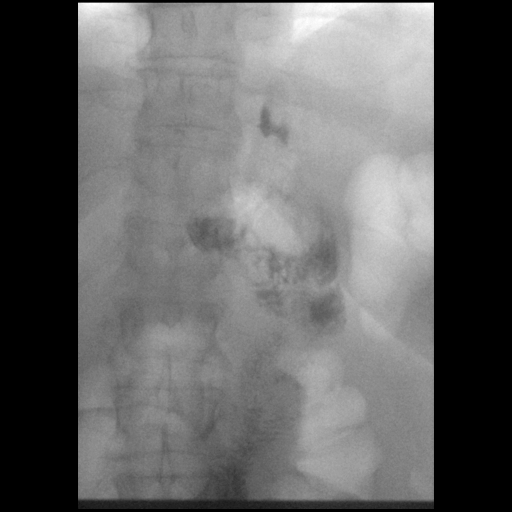

[Series 7: cp_standard · 0.34mm/px · 2 of 13 frames shown (6 of 6)]
[frame 7/13]
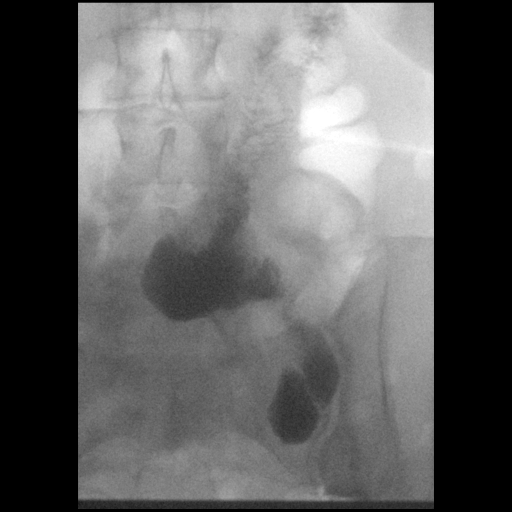
[frame 13/13]
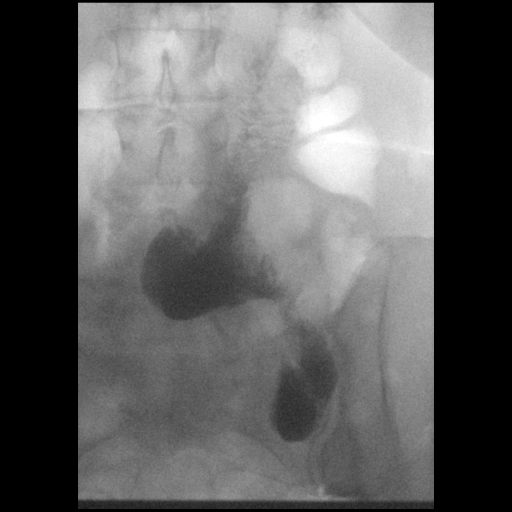

[12 of 24 positions shown; findings below may reference images not displayed]

FLUOROSCOPY TIME:  Fluoroscopy Time:  1 minutes, 48 seconds

Radiation Exposure Index (if provided by the fluoroscopic device):
35.2 mGy

Number of Acquired Spot Images: 0
FINDINGS: Initial KUB demonstrates stable right nephrolithiasis and
cholecystectomy clips. Postoperative findings along the proximal
stomach noted with staple lines.

Today' s exam was focused on the distal esophagus, stomach, and
proximal bowel.

No current hiatal hernia. Expected appearance of gastric pouch and
gastrojejunostomy with maximum caliber of the gastrojejunostomy
about 45 mm. No leak. Expected filling of the jejunum with normal
jejunal caliber. Contrast was allowed to extends distally within the
small bowel, without evidence of a complication in the expected
region of the distal anastomosis.
IMPRESSION: 1. Roux-en-y gastrojejunostomy, without leak or complicating feature
on today's postoperative day 1 assessment.
2. Stable right nephrolithiasis.

## 2019-10-04 ENCOUNTER — Encounter (HOSPITAL_COMMUNITY): Payer: Self-pay

## 2020-10-11 ENCOUNTER — Encounter (HOSPITAL_COMMUNITY): Payer: Self-pay | Admitting: *Deleted

## 2021-10-09 ENCOUNTER — Encounter (HOSPITAL_COMMUNITY): Payer: Self-pay | Admitting: *Deleted

## 2021-10-21 DIAGNOSIS — Z0000 Encounter for general adult medical examination without abnormal findings: Secondary | ICD-10-CM | POA: Diagnosis not present

## 2021-11-07 DIAGNOSIS — Z1231 Encounter for screening mammogram for malignant neoplasm of breast: Secondary | ICD-10-CM | POA: Diagnosis not present

## 2021-12-09 DIAGNOSIS — L821 Other seborrheic keratosis: Secondary | ICD-10-CM | POA: Diagnosis not present

## 2021-12-09 DIAGNOSIS — L570 Actinic keratosis: Secondary | ICD-10-CM | POA: Diagnosis not present

## 2021-12-09 DIAGNOSIS — Z85828 Personal history of other malignant neoplasm of skin: Secondary | ICD-10-CM | POA: Diagnosis not present

## 2021-12-09 DIAGNOSIS — L661 Lichen planopilaris: Secondary | ICD-10-CM | POA: Diagnosis not present

## 2022-02-26 DIAGNOSIS — Z9889 Other specified postprocedural states: Secondary | ICD-10-CM | POA: Diagnosis not present

## 2022-02-26 DIAGNOSIS — H43822 Vitreomacular adhesion, left eye: Secondary | ICD-10-CM | POA: Diagnosis not present

## 2022-06-18 DIAGNOSIS — L820 Inflamed seborrheic keratosis: Secondary | ICD-10-CM | POA: Diagnosis not present

## 2022-06-18 DIAGNOSIS — M7130 Other bursal cyst, unspecified site: Secondary | ICD-10-CM | POA: Diagnosis not present

## 2022-06-18 DIAGNOSIS — Z85828 Personal history of other malignant neoplasm of skin: Secondary | ICD-10-CM | POA: Diagnosis not present

## 2022-06-18 DIAGNOSIS — L570 Actinic keratosis: Secondary | ICD-10-CM | POA: Diagnosis not present

## 2022-06-18 DIAGNOSIS — L821 Other seborrheic keratosis: Secondary | ICD-10-CM | POA: Diagnosis not present

## 2022-09-07 DIAGNOSIS — K080 Exfoliation of teeth due to systemic causes: Secondary | ICD-10-CM | POA: Diagnosis not present

## 2022-10-26 DIAGNOSIS — Z1231 Encounter for screening mammogram for malignant neoplasm of breast: Secondary | ICD-10-CM | POA: Diagnosis not present

## 2022-10-26 DIAGNOSIS — E538 Deficiency of other specified B group vitamins: Secondary | ICD-10-CM | POA: Diagnosis not present

## 2022-10-26 DIAGNOSIS — Z9884 Bariatric surgery status: Secondary | ICD-10-CM | POA: Diagnosis not present

## 2022-10-26 DIAGNOSIS — Z79899 Other long term (current) drug therapy: Secondary | ICD-10-CM | POA: Diagnosis not present

## 2022-10-26 DIAGNOSIS — E8881 Metabolic syndrome: Secondary | ICD-10-CM | POA: Diagnosis not present

## 2022-10-26 DIAGNOSIS — Z1211 Encounter for screening for malignant neoplasm of colon: Secondary | ICD-10-CM | POA: Diagnosis not present

## 2022-10-26 DIAGNOSIS — F419 Anxiety disorder, unspecified: Secondary | ICD-10-CM | POA: Diagnosis not present

## 2022-10-26 DIAGNOSIS — Z0000 Encounter for general adult medical examination without abnormal findings: Secondary | ICD-10-CM | POA: Diagnosis not present

## 2022-10-26 DIAGNOSIS — Z124 Encounter for screening for malignant neoplasm of cervix: Secondary | ICD-10-CM | POA: Diagnosis not present

## 2022-10-26 DIAGNOSIS — Z13220 Encounter for screening for lipoid disorders: Secondary | ICD-10-CM | POA: Diagnosis not present

## 2022-10-26 DIAGNOSIS — E559 Vitamin D deficiency, unspecified: Secondary | ICD-10-CM | POA: Diagnosis not present

## 2022-11-13 DIAGNOSIS — Z1231 Encounter for screening mammogram for malignant neoplasm of breast: Secondary | ICD-10-CM | POA: Diagnosis not present

## 2022-11-13 DIAGNOSIS — R92323 Mammographic fibroglandular density, bilateral breasts: Secondary | ICD-10-CM | POA: Diagnosis not present

## 2022-12-10 DIAGNOSIS — L570 Actinic keratosis: Secondary | ICD-10-CM | POA: Diagnosis not present

## 2022-12-10 DIAGNOSIS — L661 Lichen planopilaris: Secondary | ICD-10-CM | POA: Diagnosis not present

## 2022-12-10 DIAGNOSIS — Z85828 Personal history of other malignant neoplasm of skin: Secondary | ICD-10-CM | POA: Diagnosis not present

## 2022-12-10 DIAGNOSIS — L578 Other skin changes due to chronic exposure to nonionizing radiation: Secondary | ICD-10-CM | POA: Diagnosis not present

## 2023-02-03 DIAGNOSIS — Z1211 Encounter for screening for malignant neoplasm of colon: Secondary | ICD-10-CM | POA: Diagnosis not present

## 2023-02-03 DIAGNOSIS — D123 Benign neoplasm of transverse colon: Secondary | ICD-10-CM | POA: Diagnosis not present

## 2023-02-03 DIAGNOSIS — K5730 Diverticulosis of large intestine without perforation or abscess without bleeding: Secondary | ICD-10-CM | POA: Diagnosis not present

## 2023-03-11 DIAGNOSIS — K080 Exfoliation of teeth due to systemic causes: Secondary | ICD-10-CM | POA: Diagnosis not present

## 2023-03-18 DIAGNOSIS — Z961 Presence of intraocular lens: Secondary | ICD-10-CM | POA: Diagnosis not present

## 2023-03-18 DIAGNOSIS — H35341 Macular cyst, hole, or pseudohole, right eye: Secondary | ICD-10-CM | POA: Diagnosis not present

## 2023-03-18 DIAGNOSIS — H2512 Age-related nuclear cataract, left eye: Secondary | ICD-10-CM | POA: Diagnosis not present

## 2023-04-12 DIAGNOSIS — J019 Acute sinusitis, unspecified: Secondary | ICD-10-CM | POA: Diagnosis not present

## 2023-04-12 DIAGNOSIS — B9689 Other specified bacterial agents as the cause of diseases classified elsewhere: Secondary | ICD-10-CM | POA: Diagnosis not present

## 2023-09-17 DIAGNOSIS — S41112A Laceration without foreign body of left upper arm, initial encounter: Secondary | ICD-10-CM | POA: Diagnosis not present

## 2023-09-17 DIAGNOSIS — Z1330 Encounter for screening examination for mental health and behavioral disorders, unspecified: Secondary | ICD-10-CM | POA: Diagnosis not present

## 2023-09-17 DIAGNOSIS — L03114 Cellulitis of left upper limb: Secondary | ICD-10-CM | POA: Diagnosis not present

## 2023-09-26 ENCOUNTER — Telehealth: Admitting: Nurse Practitioner

## 2023-09-26 DIAGNOSIS — R21 Rash and other nonspecific skin eruption: Secondary | ICD-10-CM | POA: Diagnosis not present

## 2023-09-26 MED ORDER — PREDNISONE 10 MG PO TABS: ORAL_TABLET | ORAL | 0 refills | Status: DC

## 2023-09-26 NOTE — Progress Notes (Signed)
 Virtual Visit Consent   Jenny Thornton, you are scheduled for a virtual visit with a [facility] provider today. Just as with appointments in the office, your consent must be obtained to participate. Your consent will be active for this visit and any virtual visit you may have with one of our providers in the next 365 days. If you have a MyChart account, a copy of this consent can be sent to you electronically.  As this is a virtual visit, video technology does not allow for your provider to perform a traditional examination. This may limit your provider's ability to fully assess your condition. If your provider identifies any concerns that need to be evaluated in person or the need to arrange testing (such as labs, EKG, etc.), we will make arrangements to do so. Although advances in technology are sophisticated, we cannot ensure that it will always work on either your end or our end. If the connection with a video visit is poor, the visit may have to be switched to a telephone visit. With either a video or telephone visit, we are not always able to ensure that we have a secure connection.  By engaging in this virtual visit, you consent to the provision of healthcare and authorize for your insurance to be billed (if applicable) for the services provided during this visit. Depending on your insurance coverage, you may receive a charge related to this service.  I need to obtain your verbal consent now. Are you willing to proceed with your visit today? [name] has provided verbal consent on 09/26/2023 for a virtual visit (video or telephone). Jenny Dean, NP  Date: 09/26/2023 7:15 PM   Virtual Visit via Video Note   I, Jenny Thornton, connected with  Jenny Thornton  ([id number], May 31, 1956) on 09/26/23 at  7:15 PM EDT by a video-enabled telemedicine application and verified that I am speaking with the correct person using two identifiers.  Location: Patient: Virtual Visit Location Patient:  Home Provider: Virtual Visit Location Provider: Home Office   I discussed the limitations of evaluation and management by telemedicine and the availability of in person appointments. The patient expressed understanding and agreed to proceed.    History of Present Illness: Jenny Thornton is a 68 y.o. who identifies as a female who was assigned female at birth, and is being seen today for allaergic reaction with skin rash.  Ms [name] was prescribed bactrim last week for presumed cellulitis. She was within 1 day of completing antibiotics and topical bactroban when she broke out into a generalized erythematous pruritic rash. She has been using hydrocortisone cream, soaking in baking powder and using antihistamine for pruritus.   Problems:  Patient Active Problem List   Diagnosis Date Noted   Obesity, Class III, BMI 40-49.9 (morbid obesity) (HCC) 03/16/2016   Hiatal hernia 03/16/2016   Prediabetes 03/16/2016   Osteoarthritis 03/16/2016   GERD (gastroesophageal reflux disease) 03/16/2016   S/P gastric bypass 03/16/2016    Allergies:  Allergies  Allergen Reactions   Codeine    Morphine And Codeine    Penicillins    Medications:  Current Outpatient Medications:    predniSONE (DELTASONE) 10 MG tablet, Take 4 tablets (40 mg total) by mouth daily with breakfast for 3 days, THEN 3 tablets (30 mg total) daily with breakfast for 3 days, THEN 2 tablets (20 mg total) daily with breakfast for 3 days, THEN 1 tablet (10 mg total) daily with breakfast for 3 days., Disp: 30 tablet, Rfl: 0  b complex vitamins tablet, Take 1 tablet by mouth daily., Disp: , Rfl:    Cholecalciferol (VITAMIN D) 2000 units CAPS, Take 2,000 Units by mouth daily., Disp: , Rfl:    cyanocobalamin 500 MCG tablet, Take 500 mcg by mouth daily., Disp: , Rfl:    cyclobenzaprine (FLEXERIL) 5 MG tablet, Take 5 mg by mouth 3 (three) times daily as needed for muscle spasms., Disp: , Rfl:    FIBER PO, Take 1 tablet by mouth daily., Disp: ,  Rfl:    HYDROcodone-acetaminophen (HYCET) 7.5-325 mg/15 ml solution, Take 10 mLs by mouth every 6 (six) hours as needed for moderate pain., Disp: 120 mL, Rfl: 0   LORazepam (ATIVAN) 0.5 MG tablet, Take 0.5 mg by mouth every 8 (eight) hours as needed for anxiety. , Disp: , Rfl:    Melatonin 10 MG TABS, Take 10 % by mouth at bedtime., Disp: , Rfl:    Multiple Vitamins-Minerals (MULTIVITAMIN WITH MINERALS) tablet, Take 1 tablet by mouth daily., Disp: , Rfl:   Observations/Objective: Patient is well-developed, well-nourished in no acute distress.  Resting comfortably at home.  Head is normocephalic, atraumatic.  No labored breathing.  Speech is clear and coherent with logical content.  Patient is alert and oriented at baseline.  Erythematous rash on chest and torso  Assessment and Plan: 1. Skin rash (Primary) - predniSONE (DELTASONE) 10 MG tablet; Take 4 tablets (40 mg total) by mouth daily with breakfast for 3 days, THEN 3 tablets (30 mg total) daily with breakfast for 3 days, THEN 2 tablets (20 mg total) daily with breakfast for 3 days, THEN 1 tablet (10 mg total) daily with breakfast for 3 days.  Dispense: 30 tablet; Refill: 0    Follow Up Instructions: I discussed the assessment and treatment plan with the patient. The patient was provided an opportunity to ask questions and all were answered. The patient agreed with the plan and demonstrated an understanding of the instructions.  A copy of instructions were sent to the patient via MyChart unless otherwise noted below.   The patient was advised to call back or seek an in-person evaluation if the symptoms worsen or if the condition fails to improve as anticipated.    Jenny [place]a W Rolland Steinert, NP

## 2023-09-26 NOTE — Patient Instructions (Signed)
 Quinn Bucco, thank you for joining Collins Dean, NP for today's virtual visit.  While this provider is not your primary care provider (PCP), if your PCP is located in our provider database this encounter information will be shared with them immediately following your visit.   A [facility] MyChart account gives you access to today's visit and all your visits, tests, and labs performed at [facility] " click here if you don't have a [facility] MyChart account or go to [email or web address]  Consent: (Patient) Lamika Connolly provided verbal consent for this virtual visit at the beginning of the encounter.  Current Medications:  Current Outpatient Medications:    predniSONE (DELTASONE) 10 MG tablet, Take 4 tablets (40 mg total) by mouth daily with breakfast for 3 days, THEN 3 tablets (30 mg total) daily with breakfast for 3 days, THEN 2 tablets (20 mg total) daily with breakfast for 3 days, THEN 1 tablet (10 mg total) daily with breakfast for 3 days., Disp: 30 tablet, Rfl: 0   b complex vitamins tablet, Take 1 tablet by mouth daily., Disp: , Rfl:    Cholecalciferol (VITAMIN D) 2000 units CAPS, Take 2,000 Units by mouth daily., Disp: , Rfl:    cyanocobalamin 500 MCG tablet, Take 500 mcg by mouth daily., Disp: , Rfl:    cyclobenzaprine (FLEXERIL) 5 MG tablet, Take 5 mg by mouth 3 (three) times daily as needed for muscle spasms., Disp: , Rfl:    FIBER PO, Take 1 tablet by mouth daily., Disp: , Rfl:    HYDROcodone-acetaminophen (HYCET) 7.5-325 mg/15 ml solution, Take 10 mLs by mouth every 6 (six) hours as needed for moderate pain., Disp: 120 mL, Rfl: 0   LORazepam (ATIVAN) 0.5 MG tablet, Take 0.5 mg by mouth every 8 (eight) hours as needed for anxiety. , Disp: , Rfl:    Melatonin 10 MG TABS, Take 10 % by mouth at bedtime., Disp: , Rfl:    Multiple Vitamins-Minerals (MULTIVITAMIN WITH MINERALS) tablet, Take 1 tablet by mouth daily., Disp: , Rfl:    Medications ordered in this  encounter:  Meds ordered this encounter  Medications   predniSONE (DELTASONE) 10 MG tablet    Sig: Take 4 tablets (40 mg total) by mouth daily with breakfast for 3 days, THEN 3 tablets (30 mg total) daily with breakfast for 3 days, THEN 2 tablets (20 mg total) daily with breakfast for 3 days, THEN 1 tablet (10 mg total) daily with breakfast for 3 days.    Dispense:  30 tablet    Refill:  0    Supervising Provider:   LAMPTEY, PHILIP O [[id number]]     *If you need refills on other medications prior to your next appointment, please contact your pharmacy*  Follow-Up: Call back or seek an in-person evaluation if the symptoms worsen or if the condition fails to improve as anticipated.  [facility] [phone]   If you have been instructed to have an in-person evaluation today at a local Urgent Care facility, please use the link below. It will take you to a list of all of our available [facility], including address, phone number and hours of operation. Please do not delay care.  [facility]  If you or a family member do not have a primary care provider, use the link below to schedule a visit and establish care. When you choose a [facility] primary care physician or advanced practice provider, you gain a long-term partner  in health. Find a Primary Care Provider  Learn more about [facility] in-office and virtual care options:  - Get Care Now

## 2023-10-27 DIAGNOSIS — K080 Exfoliation of teeth due to systemic causes: Secondary | ICD-10-CM | POA: Diagnosis not present

## 2023-10-28 DIAGNOSIS — Z9884 Bariatric surgery status: Secondary | ICD-10-CM | POA: Diagnosis not present

## 2023-10-28 DIAGNOSIS — Z136 Encounter for screening for cardiovascular disorders: Secondary | ICD-10-CM | POA: Diagnosis not present

## 2023-10-28 DIAGNOSIS — Z0000 Encounter for general adult medical examination without abnormal findings: Secondary | ICD-10-CM | POA: Diagnosis not present

## 2023-10-28 DIAGNOSIS — Z780 Asymptomatic menopausal state: Secondary | ICD-10-CM | POA: Diagnosis not present

## 2023-10-28 DIAGNOSIS — F419 Anxiety disorder, unspecified: Secondary | ICD-10-CM | POA: Diagnosis not present

## 2023-10-28 DIAGNOSIS — E538 Deficiency of other specified B group vitamins: Secondary | ICD-10-CM | POA: Diagnosis not present

## 2023-10-28 DIAGNOSIS — Z13220 Encounter for screening for lipoid disorders: Secondary | ICD-10-CM | POA: Diagnosis not present

## 2023-10-28 DIAGNOSIS — E559 Vitamin D deficiency, unspecified: Secondary | ICD-10-CM | POA: Diagnosis not present

## 2023-11-05 DIAGNOSIS — Z136 Encounter for screening for cardiovascular disorders: Secondary | ICD-10-CM | POA: Diagnosis not present

## 2023-11-05 DIAGNOSIS — Z780 Asymptomatic menopausal state: Secondary | ICD-10-CM | POA: Diagnosis not present

## 2023-11-15 DIAGNOSIS — Z1231 Encounter for screening mammogram for malignant neoplasm of breast: Secondary | ICD-10-CM | POA: Diagnosis not present

## 2023-11-15 DIAGNOSIS — R92323 Mammographic fibroglandular density, bilateral breasts: Secondary | ICD-10-CM | POA: Diagnosis not present

## 2023-11-24 DIAGNOSIS — R92321 Mammographic fibroglandular density, right breast: Secondary | ICD-10-CM | POA: Diagnosis not present

## 2023-11-24 DIAGNOSIS — R928 Other abnormal and inconclusive findings on diagnostic imaging of breast: Secondary | ICD-10-CM | POA: Diagnosis not present

## 2023-11-24 DIAGNOSIS — R921 Mammographic calcification found on diagnostic imaging of breast: Secondary | ICD-10-CM | POA: Diagnosis not present

## 2024-01-12 ENCOUNTER — Telehealth: Payer: Self-pay | Admitting: Adult Health

## 2024-01-12 NOTE — Telephone Encounter (Signed)
 Lmom asking pt to callback concerning scheduling an appt with dr darleene

## 2024-01-18 NOTE — Telephone Encounter (Signed)
Pt has been sch

## 2024-03-21 ENCOUNTER — Encounter: Payer: Self-pay | Admitting: Adult Health

## 2024-03-21 ENCOUNTER — Ambulatory Visit: Admitting: Adult Health

## 2024-03-21 VITALS — BP 126/78 | HR 74 | Temp 98.2°F | Ht 65.0 in | Wt 210.0 lb

## 2024-03-21 DIAGNOSIS — Z6834 Body mass index (BMI) 34.0-34.9, adult: Secondary | ICD-10-CM

## 2024-03-21 DIAGNOSIS — N200 Calculus of kidney: Secondary | ICD-10-CM | POA: Diagnosis not present

## 2024-03-21 DIAGNOSIS — F419 Anxiety disorder, unspecified: Secondary | ICD-10-CM

## 2024-03-21 DIAGNOSIS — Z7689 Persons encountering health services in other specified circumstances: Secondary | ICD-10-CM

## 2024-03-21 DIAGNOSIS — K5792 Diverticulitis of intestine, part unspecified, without perforation or abscess without bleeding: Secondary | ICD-10-CM

## 2024-03-21 DIAGNOSIS — Z9884 Bariatric surgery status: Secondary | ICD-10-CM | POA: Diagnosis not present

## 2024-03-21 DIAGNOSIS — Z1231 Encounter for screening mammogram for malignant neoplasm of breast: Secondary | ICD-10-CM

## 2024-03-21 DIAGNOSIS — H9313 Tinnitus, bilateral: Secondary | ICD-10-CM

## 2024-03-21 DIAGNOSIS — E66811 Obesity, class 1: Secondary | ICD-10-CM

## 2024-03-21 NOTE — Progress Notes (Signed)
 Patient presents to clinic today to establish care. She is a pleasant 68 year old female who  has a past medical history of Arthritis, BMI 40.0-44.9, adult (HCC), Cancer (HCC), GERD (gastroesophageal reflux disease), and History of kidney stones.  She was previously a patient at [facility] - her last CPE was in May 2025.   Acute Concerns: Establish Care   Chronic Issues: Anxiety - managed with Lexapro 10 mg daily and Lorazepam 0.5 mg Q8H PRN  Kidney Stones - She has a history of nephrostomy and nephrolithotomy. Calcium stones. Takes Calcium Citrate  Diverticulitis - no recent flares. She uses fiber daily.   Vitamin D Deficiency - takes oral supplements with MVI  History of Gastric Bypass - had gastric bypass in 2017. Her lowest weight was 185  Tinnitus - has been present for the last 16 years, she has become used to it now.   Health Maintenance: Dental --Routine Care  Vision --Routine Care Immunizations -- Will get Covid 19 and Flu at local pharmacy  Colonoscopy -- 2024 - through [facility] - she is due for repeat in 5 yeas  Mammogram -- UTD but do in December 2025 for calcifications in right upper breast PAP -- 2024  Bone Density -- May 2025- normal.  Diet: Does not follow a diet.  Exercise: Does not exercise on a routine basis   Past Medical History:  Diagnosis Date   Arthritis    mild fingers-no problems   BMI 40.0-44.9, adult (HCC)    Obesity   Cancer (HCC)    basal cell carcinoma-forehead   GERD (gastroesophageal reflux disease)    mild   History of kidney stones    x4 occurences    Past Surgical History:  Procedure Laterality Date   CATARACT EXTRACTION Right 2017   CESAREAN SECTION     x2    CHOLECYSTECTOMY     laparoscopic   LAPAROSCOPIC ROUX-EN-Y GASTRIC BYPASS WITH HIATAL HERNIA REPAIR N/A 03/16/2016   Procedure: LAPAROSCOPIC ROUX-EN-Y GASTRIC BYPASS WITH HIATAL HERNIA REPAIR, UPPER ENDOSCOPY;  Surgeon: Camellia Blush, MD;  Location: [facility];   Service: General;  Laterality: N/A;   RETINAL LASER PROCEDURE Right 2017    Current Outpatient Medications on File Prior to Visit  Medication Sig Dispense Refill   betamethasone, augmented, (DIPROLENE) 0.05 % lotion Apply topically 2 (two) times daily.     Biotin 1000 MCG tablet Take by mouth.     Calcium Citrate-Vitamin D (CALCIUM CITRATE CHEWY BITE) 500-12.5 MG-MCG CHEW Chew by mouth.     cyclobenzaprine (FLEXERIL) 5 MG tablet Take 5 mg by mouth 3 (three) times daily as needed for muscle spasms.     doxycycline (VIBRAMYCIN) 50 MG capsule Take 50 mg by mouth daily.     doxylamine, Sleep, (UNISOM) 25 MG tablet Take by mouth.     escitalopram (LEXAPRO) 10 MG tablet Take 10 mg by mouth daily.     FIBER PO Take 1 tablet by mouth daily.     LORazepam (ATIVAN) 0.5 MG tablet Take 0.5 mg by mouth every 8 (eight) hours as needed for anxiety.      Melatonin 10 MG TABS Take 10 % by mouth at bedtime.     Multiple Vitamins-Minerals (MULTIVITAMIN WITH MINERALS) tablet Take 1 tablet by mouth daily.     mupirocin ointment (BACTROBAN) 2 % Apply to affected area 3 times daily x 10 days     No current facility-administered medications on file prior to visit.  Allergies  Allergen Reactions   Sulfamethoxazole-Trimethoprim Dermatitis, Hives and Itching   Codeine    Morphine And Codeine    Penicillins     No family history on file.  Social History   Socioeconomic History   Marital status: Married    Spouse name: Not on file   Number of children: Not on file   Years of education: Not on file   Highest education level: Bachelor's degree (e.g., BA, AB, BS)  Occupational History   Not on file  Tobacco Use   Smoking status: Never   Smokeless tobacco: Never  Substance and Sexual Activity   Alcohol use: Yes    Comment: very rare   Drug use: No   Sexual activity: Yes  Other Topics Concern   Not on file  Social History Narrative   Not on file   Social Drivers of Health   Financial  Resource Strain: Low Risk  (03/18/2024)   Overall Financial Resource Strain (CARDIA)    Difficulty of Paying Living Expenses: Not hard at all  Food Insecurity: No Food Insecurity (03/18/2024)   Hunger Vital Sign    Worried About Running Out of Food in the Last Year: Never true    Ran Out of Food in the Last Year: Never true  Transportation Needs: No Transportation Needs (03/18/2024)   PRAPARE - Administrator, Civil Service (Medical): No    Lack of Transportation (Non-Medical): No  Physical Activity: Insufficiently Active (03/18/2024)   Exercise Vital Sign    Days of Exercise per Week: 1 day    Minutes of Exercise per Session: 30 min  Stress: No Stress Concern Present (03/18/2024)   [employer] of Occupational Health - Occupational Stress Questionnaire    Feeling of Stress: Only a little  Social Connections: Moderately Integrated (03/18/2024)   Social Connection and Isolation Panel    Frequency of Communication with Friends and Family: More than three times a week    Frequency of Social Gatherings with Friends and Family: More than three times a week    Attends Religious Services: More than 4 times per year    Active Member of [employer] or Organizations: No    Attends Banker Meetings: Not on file    Marital Status: Married  Catering manager Violence: Not At Risk (10/25/2023)   Received from [facility]   HITS    Over the last 12 months how often did your partner physically hurt you?: Never    Over the last 12 months how often did your partner insult you or talk down to you?: Never    Over the last 12 months how often did your partner threaten you with physical harm?: Never    Over the last 12 months how often did your partner scream or curse at you?: Never    Review of Systems  Constitutional: Negative.   HENT:  Positive for tinnitus.   Eyes: Negative.   Respiratory: Negative.    Cardiovascular: Negative.   Gastrointestinal: Negative.   Genitourinary:  Negative.   Musculoskeletal: Negative.   Skin: Negative.   Neurological: Negative.   Endo/Heme/Allergies: Negative.   Psychiatric/Behavioral: Negative.      BP 126/78   Pulse 74   Temp 98.2 F (36.8 C) (Oral)   Ht 5' 5 (1.651 m)   Wt 210 lb (95.3 kg)   SpO2 97%   BMI 34.95 kg/m   Physical Exam Vitals and nursing note reviewed.  Constitutional:  Appearance: Normal appearance. She is obese.  Cardiovascular:     Rate and Rhythm: Normal rate and regular rhythm.     Pulses: Normal pulses.     Heart sounds: Normal heart sounds.  Pulmonary:     Effort: Pulmonary effort is normal.     Breath sounds: Normal breath sounds.  Skin:    General: Skin is warm and dry.  Neurological:     General: No focal deficit present.     Mental Status: She is alert and oriented to person, place, and time.  Psychiatric:        Mood and Affect: Mood normal.        Behavior: Behavior normal.        Thought Content: Thought content normal.        Judgment: Judgment normal.      Assessment/Plan: 1. Encounter to establish care - Follow up in May 2026 for CPE or sooner if needed  2. Anxiety (Primary) - Continue with Lexapro 10 mg daily and Ativan PRN  3. Kidney stones - Continue to monitor   4. Diverticulitis - No recent exacerbations.  - Continue Fiber  5. History of gastric bypass - Work on weight loss through diet and exercise  6. Tinnitus of both ears - Continue to monitor  7. Screening mammogram for breast cancer - She would like to have this done in [place].  - MM 3D SCREENING MAMMOGRAM BILATERAL BREAST; Future  8. Class 1 obesity - Encouraged lifestyle modifications   9. BMI 34.0-34.9,adult - Encouraged lifestyle modifications   Jenny Shape, NP

## 2024-03-21 NOTE — Patient Instructions (Signed)
 It was great seeing you today   Please schedule you physical

## 2024-03-25 ENCOUNTER — Encounter: Payer: Self-pay | Admitting: Adult Health

## 2024-03-31 ENCOUNTER — Encounter: Payer: Self-pay | Admitting: Adult Health

## 2024-03-31 NOTE — Telephone Encounter (Signed)
**Note De-identified  [place] Obfuscation** Please advise 

## 2024-04-04 ENCOUNTER — Other Ambulatory Visit: Payer: Self-pay | Admitting: Adult Health

## 2024-04-04 DIAGNOSIS — R928 Other abnormal and inconclusive findings on diagnostic imaging of breast: Secondary | ICD-10-CM

## 2024-05-18 DIAGNOSIS — K080 Exfoliation of teeth due to systemic causes: Secondary | ICD-10-CM | POA: Diagnosis not present

## 2024-05-26 ENCOUNTER — Ambulatory Visit
Admission: RE | Admit: 2024-05-26 | Discharge: 2024-05-26 | Disposition: A | Source: Ambulatory Visit | Attending: Adult Health

## 2024-05-26 DIAGNOSIS — R928 Other abnormal and inconclusive findings on diagnostic imaging of breast: Secondary | ICD-10-CM | POA: Diagnosis not present
